# Patient Record
Sex: Female | Born: 1935 | Race: White | Hispanic: No | Marital: Married | Smoking: Never smoker
Health system: Southern US, Community
[De-identification: ages and names within clinical notes are randomized; demographics above are authoritative.]

## PROBLEM LIST (undated history)

## (undated) DIAGNOSIS — M069 Rheumatoid arthritis, unspecified: Secondary | ICD-10-CM

## (undated) DIAGNOSIS — K219 Gastro-esophageal reflux disease without esophagitis: Secondary | ICD-10-CM

## (undated) DIAGNOSIS — G4700 Insomnia, unspecified: Secondary | ICD-10-CM

## (undated) DIAGNOSIS — D649 Anemia, unspecified: Secondary | ICD-10-CM

## (undated) DIAGNOSIS — M5136 Other intervertebral disc degeneration, lumbar region: Secondary | ICD-10-CM

## (undated) DIAGNOSIS — K589 Irritable bowel syndrome without diarrhea: Secondary | ICD-10-CM

## (undated) DIAGNOSIS — E039 Hypothyroidism, unspecified: Secondary | ICD-10-CM

## (undated) DIAGNOSIS — R6889 Other general symptoms and signs: Secondary | ICD-10-CM

## (undated) DIAGNOSIS — R682 Dry mouth, unspecified: Secondary | ICD-10-CM

## (undated) DIAGNOSIS — E232 Diabetes insipidus: Secondary | ICD-10-CM

## (undated) DIAGNOSIS — F0280 Dementia in other diseases classified elsewhere without behavioral disturbance: Secondary | ICD-10-CM

## (undated) DIAGNOSIS — G309 Alzheimer's disease, unspecified: Secondary | ICD-10-CM

## (undated) DIAGNOSIS — M810 Age-related osteoporosis without current pathological fracture: Secondary | ICD-10-CM

## (undated) HISTORY — PX: ABDOMINAL HYSTERECTOMY: SHX81

## (undated) HISTORY — PX: OTHER SURGICAL HISTORY: SHX169

---

## 1998-05-08 ENCOUNTER — Ambulatory Visit (HOSPITAL_COMMUNITY): Admission: RE | Admit: 1998-05-08 | Discharge: 1998-05-08 | Payer: Self-pay | Admitting: Cardiology

## 1999-05-28 ENCOUNTER — Encounter: Payer: Self-pay | Admitting: Cardiology

## 1999-05-28 ENCOUNTER — Ambulatory Visit (HOSPITAL_COMMUNITY): Admission: RE | Admit: 1999-05-28 | Discharge: 1999-05-28 | Payer: Self-pay | Admitting: Cardiology

## 1999-06-26 ENCOUNTER — Other Ambulatory Visit: Admission: RE | Admit: 1999-06-26 | Discharge: 1999-06-26 | Payer: Self-pay | Admitting: Cardiology

## 2000-08-20 ENCOUNTER — Ambulatory Visit (HOSPITAL_COMMUNITY): Admission: RE | Admit: 2000-08-20 | Discharge: 2000-08-20 | Payer: Self-pay | Admitting: Cardiology

## 2000-08-20 ENCOUNTER — Encounter (HOSPITAL_BASED_OUTPATIENT_CLINIC_OR_DEPARTMENT_OTHER): Payer: Self-pay | Admitting: Internal Medicine

## 2001-08-25 ENCOUNTER — Encounter (HOSPITAL_BASED_OUTPATIENT_CLINIC_OR_DEPARTMENT_OTHER): Payer: Self-pay | Admitting: Internal Medicine

## 2001-08-25 ENCOUNTER — Ambulatory Visit (HOSPITAL_COMMUNITY): Admission: RE | Admit: 2001-08-25 | Discharge: 2001-08-25 | Payer: Self-pay | Admitting: Internal Medicine

## 2001-09-08 ENCOUNTER — Ambulatory Visit (HOSPITAL_BASED_OUTPATIENT_CLINIC_OR_DEPARTMENT_OTHER): Admission: RE | Admit: 2001-09-08 | Discharge: 2001-09-08 | Payer: Self-pay | Admitting: Urology

## 2001-09-08 ENCOUNTER — Encounter (INDEPENDENT_AMBULATORY_CARE_PROVIDER_SITE_OTHER): Payer: Self-pay | Admitting: Specialist

## 2002-11-16 ENCOUNTER — Ambulatory Visit (HOSPITAL_COMMUNITY): Admission: RE | Admit: 2002-11-16 | Discharge: 2002-11-16 | Payer: Self-pay | Admitting: Internal Medicine

## 2003-02-09 ENCOUNTER — Encounter (HOSPITAL_BASED_OUTPATIENT_CLINIC_OR_DEPARTMENT_OTHER): Payer: Self-pay | Admitting: Internal Medicine

## 2003-02-09 ENCOUNTER — Ambulatory Visit (HOSPITAL_COMMUNITY): Admission: RE | Admit: 2003-02-09 | Discharge: 2003-02-09 | Payer: Self-pay | Admitting: Internal Medicine

## 2004-05-10 ENCOUNTER — Ambulatory Visit (HOSPITAL_COMMUNITY): Admission: RE | Admit: 2004-05-10 | Discharge: 2004-05-10 | Payer: Self-pay | Admitting: Gastroenterology

## 2005-09-09 ENCOUNTER — Ambulatory Visit (HOSPITAL_COMMUNITY): Admission: RE | Admit: 2005-09-09 | Discharge: 2005-09-09 | Payer: Self-pay | Admitting: Gastroenterology

## 2006-10-01 ENCOUNTER — Ambulatory Visit (HOSPITAL_COMMUNITY): Admission: RE | Admit: 2006-10-01 | Discharge: 2006-10-01 | Payer: Self-pay | Admitting: Internal Medicine

## 2010-05-01 ENCOUNTER — Ambulatory Visit (HOSPITAL_COMMUNITY): Admission: RE | Admit: 2010-05-01 | Discharge: 2010-05-01 | Payer: Self-pay | Admitting: Internal Medicine

## 2011-01-11 NOTE — Op Note (Signed)
[facility]  Patient:    Christina Garcia, Christina Garcia Visit Number: [id number] MRN: [id number]          Service Type: NES Location: [facility] Attending Physician:  Tania Ade Dictated by:   Lucrezia Starch. Ovidio Hanger, M.D. Proc. Date: 09/08/01 Admit Date:  09/08/2001                             Operative Report  DIAGNOSIS:  Hematuria.  OPERATIVE PROCEDURE:  Cystourethroscopy, examination under anesthesia, bladder biopsy, and barbotage bladder cytology.  SURGEON:  Lucrezia Starch. Ovidio Hanger, M.D.  ANESTHESIA:  General laryngeal airway.  BLOOD LOSS:  Negligible.  TUBES:  None.  COMPLICATIONS:  None.  INDICATION FOR PROCEDURE:  [name] is a lovely 77 year old white female, who has presented with pelvic pain, low back pain, and pain in her groin area. She was subsequently found to have microhematuria and underwent the work-up and on cystourethroscopy, was found to have areas of cystitis cystica and glandularis, diffuse inflammation of the bladder with some trabeculation, and it was felt the above procedure was indicated.  After understanding the risks, benefits, and alternatives, she has elected to proceed.  PROCEDURE IN DETAIL:  The patient was placed in the supine position.  After proper general laryngeal airway anesthesia, was placed in the dorsal lithotomy position and prepped and draped with Betadine in a sterile fashion. Cystourethroscopy was performed with a 22.5 French Olympus panendoscope utilizing the 12 and 70 degree lenses.  The bladder was carefully inspected. Efflux of clear urine was noted from the normally-placed ureteral orifices bilaterally.  The urethra appeared to be essentially normal.  The trigone had some trigonitis along with some areas of cystitis glandularis.  There was grade 1 trabeculation in the posterior bladder wall.  There was some significant diffuse inflammation.  It was felt that biopsy was indicated. Barbotage bladder  cytologies were obtained with saline and submitted to pathology.  The posterior midline was biopsied with cold cup biopsy forceps. There was an area of inflammation there and submitted to pathology, and the base was cauterized with Bovie coagulation cautery.  The bladder was drained. The panendoscope was removed.  Examination under anesthesia revealed no palpable abnormalities in the bladder.  The patient tolerated the procedure well and was taken to the recovery room stable. Dictated by:   Lucrezia Starch. Ovidio Hanger, M.D. Attending Physician:  Tania Ade DD:  09/08/01 TD:  09/08/01 Job: [phone] [id number]

## 2013-10-01 ENCOUNTER — Other Ambulatory Visit (HOSPITAL_COMMUNITY): Payer: Self-pay | Admitting: Internal Medicine

## 2013-10-01 DIAGNOSIS — Z1231 Encounter for screening mammogram for malignant neoplasm of breast: Secondary | ICD-10-CM

## 2013-10-28 ENCOUNTER — Ambulatory Visit (HOSPITAL_COMMUNITY)
Admission: RE | Admit: 2013-10-28 | Discharge: 2013-10-28 | Disposition: A | Discharge status: Home / Self Care | Payer: Medicare Other | Source: Ambulatory Visit | Attending: Internal Medicine | Admitting: Internal Medicine

## 2013-10-28 DIAGNOSIS — Z1231 Encounter for screening mammogram for malignant neoplasm of breast: Secondary | ICD-10-CM | POA: Insufficient documentation

## 2014-10-31 ENCOUNTER — Encounter (HOSPITAL_COMMUNITY)
Admission: RE | Admit: 2014-10-31 | Discharge: 2014-10-31 | Disposition: A | Discharge status: Home / Self Care | Payer: PPO | Source: Ambulatory Visit | Attending: Internal Medicine | Admitting: Internal Medicine

## 2014-10-31 ENCOUNTER — Other Ambulatory Visit (HOSPITAL_COMMUNITY): Payer: Self-pay | Admitting: Internal Medicine

## 2014-10-31 ENCOUNTER — Encounter (HOSPITAL_COMMUNITY): Payer: Self-pay

## 2014-10-31 DIAGNOSIS — M810 Age-related osteoporosis without current pathological fracture: Secondary | ICD-10-CM | POA: Diagnosis not present

## 2014-10-31 HISTORY — DX: Other general symptoms and signs: R68.89

## 2014-10-31 HISTORY — DX: Age-related osteoporosis without current pathological fracture: M81.0

## 2014-10-31 HISTORY — DX: Dry mouth, unspecified: R68.2

## 2014-10-31 MED ORDER — DENOSUMAB 60 MG/ML ~~LOC~~ SOLN
60.0000 mg | Freq: Once | SUBCUTANEOUS | Status: AC
  Administered 2014-10-31: 60 mg via SUBCUTANEOUS
  Filled 2014-10-31: qty 1

## 2014-10-31 NOTE — Discharge Instructions (Signed)
Prolia °Denosumab injection °What is this medicine? °DENOSUMAB (den oh sue mab) slows bone breakdown. Prolia is used to treat osteoporosis in women after menopause and in men. Xgeva is used to prevent bone fractures and other bone problems caused by cancer bone metastases. Xgeva is also used to treat giant cell tumor of the bone. °This medicine may be used for other purposes; ask your health care provider or pharmacist if you have questions. °COMMON BRAND NAME(S): Prolia, XGEVA °What should I tell my health care provider before I take this medicine? °They need to know if you have any of these conditions: °-dental disease °-eczema °-infection or history of infections °-kidney disease or on dialysis °-low blood calcium or vitamin D °-malabsorption syndrome °-scheduled to have surgery or tooth extraction °-taking medicine that contains denosumab °-thyroid or parathyroid disease °-an unusual reaction to denosumab, other medicines, foods, dyes, or preservatives °-pregnant or trying to get pregnant °-breast-feeding °How should I use this medicine? °This medicine is for injection under the skin. It is given by a health care professional in a hospital or clinic setting. °If you are getting Prolia, a special MedGuide will be given to you by the pharmacist with each prescription and refill. Be sure to read this information carefully each time. °For Prolia, talk to your pediatrician regarding the use of this medicine in children. Special care may be needed. For Xgeva, talk to your pediatrician regarding the use of this medicine in children. While this drug may be prescribed for children as young as 13 years for selected conditions, precautions do apply. °Overdosage: If you think you've taken too much of this medicine contact a poison control center or emergency room at once. °Overdosage: If you think you have taken too much of this medicine contact a poison control center or emergency room at once. °NOTE: This medicine is  only for you. Do not share this medicine with others. °What if I miss a dose? °It is important not to miss your dose. Call your doctor or health care professional if you are unable to keep an appointment. °What may interact with this medicine? °Do not take this medicine with any of the following medications: °-other medicines containing denosumab °This medicine may also interact with the following medications: °-medicines that suppress the immune system °-medicines that treat cancer °-steroid medicines like prednisone or cortisone °This list may not describe all possible interactions. Give your health care provider a list of all the medicines, herbs, non-prescription drugs, or dietary supplements you use. Also tell them if you smoke, drink alcohol, or use illegal drugs. Some items may interact with your medicine. °What should I watch for while using this medicine? °Visit your doctor or health care professional for regular checks on your progress. Your doctor or health care professional may order blood tests and other tests to see how you are doing. °Call your doctor or health care professional if you get a cold or other infection while receiving this medicine. Do not treat yourself. This medicine may decrease your body's ability to fight infection. °You should make sure you get enough calcium and vitamin D while you are taking this medicine, unless your doctor tells you not to. Discuss the foods you eat and the vitamins you take with your health care professional. °See your dentist regularly. Brush and floss your teeth as directed. Before you have any dental work done, tell your dentist you are receiving this medicine. °Do not become pregnant while taking this medicine or for 5 months after   stopping it. Women should inform their doctor if they wish to become pregnant or think they might be pregnant. There is a potential for serious side effects to an unborn child. Talk to your health care professional or pharmacist  for more information. °What side effects may I notice from receiving this medicine? °Side effects that you should report to your doctor or health care professional as soon as possible: °-allergic reactions like skin rash, itching or hives, swelling of the face, lips, or tongue °-breathing problems °-chest pain °-fast, irregular heartbeat °-feeling faint or lightheaded, falls °-fever, chills, or any other sign of infection °-muscle spasms, tightening, or twitches °-numbness or tingling °-skin blisters or bumps, or is dry, peels, or red °-slow healing or unexplained pain in the mouth or jaw °-unusual bleeding or bruising °Side effects that usually do not require medical attention (Report these to your doctor or health care professional if they continue or are bothersome.): °-muscle pain °-stomach upset, gas °This list may not describe all possible side effects. Call your doctor for medical advice about side effects. You may report side effects to FDA at 1-800-FDA-1088. °Where should I keep my medicine? °This medicine is only given in a clinic, doctor's office, or other health care setting and will not be stored at home. °NOTE: This sheet is a summary. It may not cover all possible information. If you have questions about this medicine, talk to your doctor, pharmacist, or health care provider. °© 2015, Elsevier/Gold Standard. (2012-02-10 12:37:47) °Osteoporosis °Throughout your life, your body breaks down old bone and replaces it with new bone. As you get older, your body does not replace bone as quickly as it breaks it down. By the age of 30 years, most people begin to gradually lose bone because of the imbalance between bone loss and replacement. Some people lose more bone than others. Bone loss beyond a specified normal degree is considered osteoporosis.  °Osteoporosis affects the strength and durability of your bones. The inside of the ends of your bones and your flat bones, like the bones of your pelvis, look like  honeycomb, filled with tiny open spaces. As bone loss occurs, your bones become less dense. This means that the open spaces inside your bones become bigger and the walls between these spaces become thinner. This makes your bones weaker. Bones of a person with osteoporosis can become so weak that they can break (fracture) during minor accidents, such as a simple fall. °CAUSES  °The following factors have been associated with the development of osteoporosis: °· Smoking. °· Drinking more than 2 alcoholic drinks several days per week. °· Long-term use of certain medicines: °¨ Corticosteroids. °¨ Chemotherapy medicines. °¨ Thyroid medicines. °¨ Antiepileptic medicines. °¨ Gonadal hormone suppression medicine. °¨ Immunosuppression medicine. °· Being underweight. °· Lack of physical activity. °· Lack of exposure to the sun. This can lead to vitamin D deficiency. °· Certain medical conditions: °¨ Certain inflammatory bowel diseases, such as Crohn disease and ulcerative colitis. °¨ Diabetes. °¨ Hyperthyroidism. °¨ Hyperparathyroidism. °RISK FACTORS °Anyone can develop osteoporosis. However, the following factors can increase your risk of developing osteoporosis: °· Gender--Women are at higher risk than men. °· Age--Being older than 50 years increases your risk. °· Ethnicity--White and Asian people have an increased risk. °· Weight --Being extremely underweight can increase your risk of osteoporosis. °· Family history of osteoporosis--Having a family member who has developed osteoporosis can increase your risk. °SYMPTOMS  °Usually, people with osteoporosis have no symptoms.  °DIAGNOSIS  °Signs during   a physical exam that may prompt your caregiver to suspect osteoporosis include: °· Decreased height. This is usually caused by the compression of the bones that form your spine (vertebrae) because they have weakened and become fractured. °· A curving or rounding of the upper back (kyphosis). °To confirm signs of osteoporosis,  your caregiver may request a procedure that uses 2 low-dose X-ray beams with different levels of energy to measure your bone mineral density (dual-energy X-ray absorptiometry [DXA]). Also, your caregiver may check your level of vitamin D. °TREATMENT  °The goal of osteoporosis treatment is to strengthen bones in order to decrease the risk of bone fractures. There are different types of medicines available to help achieve this goal. Some of these medicines work by slowing the processes of bone loss. Some medicines work by increasing bone density. Treatment also involves making sure that your levels of calcium and vitamin D are adequate. °PREVENTION  °There are things you can do to help prevent osteoporosis. Adequate intake of calcium and vitamin D can help you achieve optimal bone mineral density. Regular exercise can also help, especially resistance and weight-bearing activities. If you smoke, quitting smoking is an important part of osteoporosis prevention. °MAKE SURE YOU: °· Understand these instructions. °· Will watch your condition. °· Will get help right away if you are not doing well or get worse. °FOR MORE INFORMATION °www.osteo.org and www.nof.org °Document Released: 05/22/2005 Document Revised: 12/07/2012 Document Reviewed: 07/27/2011 °ExitCare® Patient Information ©2015 [employer]. This information is not intended to replace advice given to you by your health care provider. Make sure you discuss any questions you have with your health care provider. ° ° °

## 2014-10-31 NOTE — Progress Notes (Signed)
Pt here for her PROLIA injection. She had been getting it in Dr [employer] office and now coming ot Short Stay every 6 month. Pt and husband accompanied by son and are poor historians as far as medications but medication list faxed from Dr Silvano Rusk office was transcribed into [facility]. They are unsure if she takes oral calcium but she "eats a lot of cheese and dairy" and Vitamin D is listed on her medication list. Uneventful injection of Prolia and patient was discharged ambulatory accompanied by husband and son to [employer] exit.

## 2015-01-29 ENCOUNTER — Ambulatory Visit (INDEPENDENT_AMBULATORY_CARE_PROVIDER_SITE_OTHER): Payer: PPO | Admitting: Emergency Medicine

## 2015-01-29 VITALS — BP 136/72 | HR 80 | Temp 98.4°F | Resp 16 | Ht 59.5 in | Wt 118.4 lb

## 2015-01-29 DIAGNOSIS — J209 Acute bronchitis, unspecified: Secondary | ICD-10-CM | POA: Diagnosis not present

## 2015-01-29 NOTE — Progress Notes (Signed)
Subjective:  Patient ID: Christina Garcia, female    DOB: 08-28-1932  Age: 81 y.o. MRN: [id number]  CC: URI and Wheezing   HPI Christina Garcia presents   For follow-up of her bronchitis. She was treated by phone by her family doctor last week. She was noted to have a cough and gurgling at night. He prescribed Zithromax and she completed the medication today. The family was concerned that she may still have residual illness. She has very little cough and the cough that she has nonproductive she has no wheezing or shortness of breath. No nausea or vomiting. No fever or chills. No rash. No gastrointestinal symptoms of any kind.. Maintaining her weight. She is eating and drinking well.  History Christina Garcia has a past medical history of Osteoporosis; Dry mouth; and Forgetfulness.   She has past surgical history that includes none.   Her  family history is not on file.  She   reports that she has never smoked. She does not have any smokeless tobacco history on file. She reports that she does not drink alcohol or use illicit drugs.  Outpatient Prescriptions Prior to Visit  Medication Sig Dispense Refill  . aspirin 81 MG tablet Take 81 mg by mouth daily.    Marland Kitchen denosumab (PROLIA) 60 MG/ML SOLN injection Inject 60 mg into the skin every 6 (six) months. Administer in upper arm, thigh, or abdomen    . desmopressin (DDAVP) 0.2 MG tablet Take 0.2 mg by mouth 2 (two) times daily.    Marland Kitchen donepezil (ARICEPT) 5 MG tablet Take 5 mg by mouth at bedtime.    . ergocalciferol (VITAMIN D2) 50000 UNITS capsule Take 50,000 Units by mouth once a week.    . levothyroxine (SYNTHROID, LEVOTHROID) 75 MCG tablet Take 75 mcg by mouth daily before breakfast.    . Linaclotide (LINZESS) 290 MCG CAPS capsule Take 290 mcg by mouth daily before breakfast.    . memantine (NAMENDA XR) 28 MG CP24 24 hr capsule Take 28 mg by mouth.    . mometasone (ELOCON) 0.1 % cream Apply 1 application topically as needed.    . naproxen sodium  (ANAPROX) 220 MG tablet Take 220 mg by mouth as needed.    . pravastatin (PRAVACHOL) 20 MG tablet Take 20 mg by mouth daily.     No facility-administered medications prior to visit.    History   Social History  . Marital Status: Married    Spouse Name: N/A  . Number of Children: N/A  . Years of Education: N/A   Social History Main Topics  . Smoking status: Never Smoker   . Smokeless tobacco: Not on file  . Alcohol Use: No  . Drug Use: No  . Sexual Activity: Not on file   Other Topics Concern  . None   Social History Narrative     Review of Systems  Constitutional: Positive for fatigue. Negative for fever, chills and appetite change.  HENT: Positive for congestion and rhinorrhea. Negative for ear pain, postnasal drip, sinus pressure and sore throat.   Eyes: Negative for pain and redness.  Respiratory: Positive for cough and wheezing. Negative for shortness of breath.   Cardiovascular: Negative for leg swelling.  Gastrointestinal: Negative for nausea, vomiting, abdominal pain, diarrhea, constipation and blood in stool.  Endocrine: Negative for polyuria.  Genitourinary: Negative for dysuria, urgency, frequency and flank pain.  Musculoskeletal: Negative for gait problem.  Skin: Negative for rash.  Neurological: Positive for headaches. Negative for weakness.  Psychiatric/Behavioral: Negative for confusion and decreased concentration. The patient is not nervous/anxious.     Objective:  BP 136/72 mmHg  Pulse 80  Temp(Src) 98.4 F (36.9 C) (Oral)  Resp 16  Ht 4' 11.5" (1.511 m)  Wt 118 lb 6.4 oz (53.706 kg)  BMI 23.52 kg/m2  SpO2 98%  Physical Exam  Constitutional: She is oriented to person, place, and time. She appears well-developed and well-nourished. No distress.  HENT:  Head: Normocephalic and atraumatic.  Right Ear: External ear normal.  Left Ear: External ear normal.  Nose: Nose normal.  Eyes: Conjunctivae and EOM are normal. Pupils are equal, round, and  reactive to light. No scleral icterus.  Neck: Normal range of motion. Neck supple. No tracheal deviation present.  Cardiovascular: Normal rate, regular rhythm and normal heart sounds.   Pulmonary/Chest: Effort normal. No respiratory distress. She has no wheezes. She has no rales.  Abdominal: She exhibits no mass. There is no tenderness. There is no rebound and no guarding.  Musculoskeletal: She exhibits no edema.  Lymphadenopathy:    She has no cervical adenopathy.  Neurological: She is alert and oriented to person, place, and time. Coordination normal.  Skin: Skin is warm and dry. No rash noted.  Psychiatric: She has a normal mood and affect. Her behavior is normal.      Assessment & Plan:   Meiah was seen today for uri and wheezing.  Diagnoses and all orders for this visit:  Acute bronchitis, unspecified organism  I am having Ms. Fowles maintain her ergocalciferol, naproxen sodium, mometasone, aspirin, desmopressin, donepezil, Linaclotide, pravastatin, levothyroxine, memantine, and denosumab.  No orders of the defined types were placed in this encounter.    Appropriate red flag conditions were discussed with the patient as well as actions that should be taken.  Patient expressed his understanding.  Follow-up: Return if symptoms worsen or fail to improve.  Carmelina Dane, MD

## 2015-01-29 NOTE — Patient Instructions (Signed)

## 2015-02-21 ENCOUNTER — Emergency Department (HOSPITAL_COMMUNITY)
Admission: EM | Admit: 2015-02-21 | Discharge: 2015-02-21 | Disposition: A | Discharge status: Home / Self Care | Payer: PPO | Attending: Emergency Medicine | Admitting: Emergency Medicine

## 2015-02-21 ENCOUNTER — Emergency Department (HOSPITAL_COMMUNITY): Payer: PPO

## 2015-02-21 ENCOUNTER — Encounter (HOSPITAL_COMMUNITY): Payer: Self-pay | Admitting: Emergency Medicine

## 2015-02-21 DIAGNOSIS — Z79899 Other long term (current) drug therapy: Secondary | ICD-10-CM | POA: Insufficient documentation

## 2015-02-21 DIAGNOSIS — M79602 Pain in left arm: Secondary | ICD-10-CM | POA: Diagnosis not present

## 2015-02-21 DIAGNOSIS — F0390 Unspecified dementia without behavioral disturbance: Secondary | ICD-10-CM | POA: Diagnosis not present

## 2015-02-21 DIAGNOSIS — Z7982 Long term (current) use of aspirin: Secondary | ICD-10-CM | POA: Insufficient documentation

## 2015-02-21 DIAGNOSIS — R079 Chest pain, unspecified: Secondary | ICD-10-CM | POA: Diagnosis present

## 2015-02-21 DIAGNOSIS — M810 Age-related osteoporosis without current pathological fracture: Secondary | ICD-10-CM | POA: Diagnosis not present

## 2015-02-21 DIAGNOSIS — R52 Pain, unspecified: Secondary | ICD-10-CM

## 2015-02-21 LAB — CBC WITH DIFFERENTIAL/PLATELET
Basophils Absolute: 0 10*3/uL (ref 0.0–0.1)
Basophils Relative: 0 % (ref 0–1)
EOS PCT: 2 % (ref 0–5)
Eosinophils Absolute: 0.2 10*3/uL (ref 0.0–0.7)
HCT: 36.8 % (ref 36.0–46.0)
Hemoglobin: 12.2 g/dL (ref 12.0–15.0)
Lymphocytes Relative: 22 % (ref 12–46)
Lymphs Abs: 1.5 10*3/uL (ref 0.7–4.0)
MCH: 31.6 pg (ref 26.0–34.0)
MCHC: 33.2 g/dL (ref 30.0–36.0)
MCV: 95.3 fL (ref 78.0–100.0)
MONOS PCT: 11 % (ref 3–12)
Monocytes Absolute: 0.8 10*3/uL (ref 0.1–1.0)
NEUTROS ABS: 4.6 10*3/uL (ref 1.7–7.7)
NEUTROS PCT: 65 % (ref 43–77)
PLATELETS: 238 10*3/uL (ref 150–400)
RBC: 3.86 MIL/uL — AB (ref 3.87–5.11)
RDW: 14.3 % (ref 11.5–15.5)
WBC: 7 10*3/uL (ref 4.0–10.5)

## 2015-02-21 LAB — COMPREHENSIVE METABOLIC PANEL
ALT: 12 U/L — ABNORMAL LOW (ref 14–54)
ANION GAP: 7 (ref 5–15)
AST: 21 U/L (ref 15–41)
Albumin: 3.6 g/dL (ref 3.5–5.0)
Alkaline Phosphatase: 68 U/L (ref 38–126)
BILIRUBIN TOTAL: 0.6 mg/dL (ref 0.3–1.2)
BUN: 11 mg/dL (ref 6–20)
CO2: 27 mmol/L (ref 22–32)
Calcium: 8.5 mg/dL — ABNORMAL LOW (ref 8.9–10.3)
Chloride: 105 mmol/L (ref 101–111)
Creatinine, Ser: 0.69 mg/dL (ref 0.44–1.00)
GFR calc Af Amer: >60 mL/min (ref >60)
GLUCOSE: 98 mg/dL (ref 65–99)
POTASSIUM: 4 mmol/L (ref 3.5–5.1)
SODIUM: 139 mmol/L (ref 135–145)
TOTAL PROTEIN: 6.8 g/dL (ref 6.5–8.1)

## 2015-02-21 LAB — BRAIN NATRIURETIC PEPTIDE: B NATRIURETIC PEPTIDE 5: 43.6 pg/mL (ref 0.0–100.0)

## 2015-02-21 LAB — I-STAT TROPONIN, ED: TROPONIN I, POC: 0 ng/mL (ref 0.00–0.08)

## 2015-02-21 NOTE — ED Notes (Signed)
Pt left with family and all belongings. Pt refused wheelchair.

## 2015-02-21 NOTE — Discharge Instructions (Signed)
Follow up with your md as planned next week.  Return if problems

## 2015-02-21 NOTE — ED Notes (Signed)
Pt arrives via [facility] for c/o pain in left shoulder and chest that began today. Pt denied chest pain with ems. Was given 324mg  asa. Pt alert, hx of dementia.

## 2015-02-24 NOTE — ED Provider Notes (Signed)
CSN: [id number]     Arrival date & time 02/21/15  1629 History   First MD Initiated Contact with Patient 02/21/15 1632     Chief Complaint  Patient presents with  . Chest Pain  . Arm Pain     (Consider location/radiation/quality/duration/timing/severity/associated sxs/prior Treatment) Patient is a 81 y.o. female presenting with chest pain and arm pain. The history is provided by a relative (pt with dementia.  she had chest pain earlier today.  no pain now).  Chest Pain Pain location:  Substernal area Pain quality: aching   Pain radiates to:  Does not radiate Pain radiates to the back: no   Pain severity:  Mild Onset quality:  Gradual Associated symptoms: no abdominal pain, no back pain, no cough, no fatigue and no headache   Arm Pain Associated symptoms include chest pain. Pertinent negatives include no abdominal pain and no headaches.    Past Medical History  Diagnosis Date  . Osteoporosis   . Dry mouth     husband states she has issue with saliva glands and has to take medicaion fot hsi  . Forgetfulness    Past Surgical History  Procedure Laterality Date  . None     No family history on file. History  Substance Use Topics  . Smoking status: Never Smoker   . Smokeless tobacco: Not on file  . Alcohol Use: No   OB History    No data available     Review of Systems  Constitutional: Negative for appetite change and fatigue.  HENT: Negative for congestion, ear discharge and sinus pressure.   Eyes: Negative for discharge.  Respiratory: Negative for cough.   Cardiovascular: Positive for chest pain.  Gastrointestinal: Negative for abdominal pain and diarrhea.  Genitourinary: Negative for frequency and hematuria.  Musculoskeletal: Negative for back pain.  Skin: Negative for rash.  Neurological: Negative for seizures and headaches.  Psychiatric/Behavioral: Negative for hallucinations.      Allergies  Keflex and Fosamax  Home Medications   Prior to Admission  medications   Medication Sig Start Date End Date Taking? Authorizing Provider  aspirin 81 MG tablet Take 81 mg by mouth daily.    Historical Provider, MD  denosumab (PROLIA) 60 MG/ML SOLN injection Inject 60 mg into the skin every 6 (six) months. Administer in upper arm, thigh, or abdomen    Historical Provider, MD  desmopressin (DDAVP) 0.2 MG tablet Take 0.2 mg by mouth 2 (two) times daily.    Historical Provider, MD  donepezil (ARICEPT) 5 MG tablet Take 5 mg by mouth at bedtime.    Historical Provider, MD  ergocalciferol (VITAMIN D2) 50000 UNITS capsule Take 50,000 Units by mouth once a week.    Historical Provider, MD  levothyroxine (SYNTHROID, LEVOTHROID) 75 MCG tablet Take 75 mcg by mouth daily before breakfast.    Historical Provider, MD  Linaclotide (LINZESS) 290 MCG CAPS capsule Take 290 mcg by mouth daily before breakfast.    Historical Provider, MD  memantine (NAMENDA XR) 28 MG CP24 24 hr capsule Take 28 mg by mouth.    Historical Provider, MD  mometasone (ELOCON) 0.1 % cream Apply 1 application topically as needed.    Historical Provider, MD  naproxen sodium (ANAPROX) 220 MG tablet Take 220 mg by mouth as needed.    Historical Provider, MD  pravastatin (PRAVACHOL) 20 MG tablet Take 20 mg by mouth daily.    Historical Provider, MD   BP 134/60 mmHg  Pulse 74  Temp(Src) 98.3 F (36.8  C) (Oral)  Resp 20  SpO2 97% Physical Exam  Constitutional: She is oriented to person, place, and time. She appears well-developed.  HENT:  Head: Normocephalic.  Eyes: Conjunctivae and EOM are normal. No scleral icterus.  Neck: Neck supple. No thyromegaly present.  Cardiovascular: Normal rate and regular rhythm.  Exam reveals no gallop and no friction rub.   No murmur heard. Pulmonary/Chest: No stridor. She has no wheezes. She has no rales. She exhibits no tenderness.  Abdominal: She exhibits no distension. There is no tenderness. There is no rebound.  Musculoskeletal: Normal range of motion. She  exhibits no edema.  Lymphadenopathy:    She has no cervical adenopathy.  Neurological: She is oriented to person, place, and time. She exhibits normal muscle tone. Coordination normal.  Skin: No rash noted. No erythema.  Psychiatric: She has a normal mood and affect. Her behavior is normal.    ED Course  Procedures (including critical care time) Labs Review Labs Reviewed  CBC WITH DIFFERENTIAL/PLATELET - Abnormal; Notable for the following:    RBC 3.86 (*)    All other components within normal limits  COMPREHENSIVE METABOLIC PANEL - Abnormal; Notable for the following:    Calcium 8.5 (*)    ALT 12 (*)    All other components within normal limits  BRAIN NATRIURETIC PEPTIDE  I-STAT TROPOININ, ED    Imaging Review No results found.   EKG Interpretation   Date/Time:  Tuesday February 21 2015 16:42:22 EDT Ventricular Rate:  71 PR Interval:  147 QRS Duration: 100 QT Interval:  401 QTC Calculation: 436 R Axis:   39 Text Interpretation:  Sinus rhythm Confirmed by Amarrion Pastorino  MD, Sherolyn Trettin ([id number])  on 02/21/2015 4:46:02 PM      MDM   Final diagnoses:  Pain  Chest pain at rest    Pt with chest pain prior to er visit,  She does not complain of pain now.  Pt with dementia.  Nl troponin and no st changes for cad,   Chest x-ray and labs unremarkable,  Doubt cad.  Pt to follow up this week with pcp   Bethann Berkshire, MD 02/24/15 1420

## 2015-05-02 ENCOUNTER — Encounter (HOSPITAL_COMMUNITY): Admission: RE | Admit: 2015-05-02 | Payer: PPO | Source: Ambulatory Visit

## 2015-08-30 ENCOUNTER — Ambulatory Visit (HOSPITAL_COMMUNITY)
Admission: RE | Admit: 2015-08-30 | Discharge: 2015-08-30 | Disposition: A | Discharge status: Home / Self Care | Payer: PPO | Source: Ambulatory Visit | Attending: Internal Medicine | Admitting: Internal Medicine

## 2015-09-07 ENCOUNTER — Encounter: Payer: Self-pay | Admitting: Gastroenterology

## 2015-11-10 DIAGNOSIS — E784 Other hyperlipidemia: Secondary | ICD-10-CM | POA: Diagnosis not present

## 2015-11-10 DIAGNOSIS — G308 Other Alzheimer's disease: Secondary | ICD-10-CM | POA: Diagnosis not present

## 2015-11-10 DIAGNOSIS — M810 Age-related osteoporosis without current pathological fracture: Secondary | ICD-10-CM | POA: Diagnosis not present

## 2015-11-10 DIAGNOSIS — L309 Dermatitis, unspecified: Secondary | ICD-10-CM | POA: Diagnosis not present

## 2015-11-10 DIAGNOSIS — E038 Other specified hypothyroidism: Secondary | ICD-10-CM | POA: Diagnosis not present

## 2015-11-10 DIAGNOSIS — R634 Abnormal weight loss: Secondary | ICD-10-CM | POA: Diagnosis not present

## 2015-11-10 DIAGNOSIS — Z6822 Body mass index (BMI) 22.0-22.9, adult: Secondary | ICD-10-CM | POA: Diagnosis not present

## 2015-11-10 DIAGNOSIS — G4709 Other insomnia: Secondary | ICD-10-CM | POA: Diagnosis not present

## 2015-11-10 DIAGNOSIS — R197 Diarrhea, unspecified: Secondary | ICD-10-CM | POA: Diagnosis not present

## 2015-11-10 DIAGNOSIS — Z0000 Encounter for general adult medical examination without abnormal findings: Secondary | ICD-10-CM | POA: Diagnosis not present

## 2015-11-20 ENCOUNTER — Inpatient Hospital Stay (HOSPITAL_COMMUNITY)
Admission: EM | Admit: 2015-11-20 | Discharge: 2015-11-23 | DRG: 640 | Disposition: A | Discharge status: Skilled Nursing Facility | Payer: PPO | Source: Skilled Nursing Facility | Attending: Internal Medicine | Admitting: Internal Medicine

## 2015-11-20 ENCOUNTER — Encounter (HOSPITAL_COMMUNITY): Payer: Self-pay | Admitting: Emergency Medicine

## 2015-11-20 ENCOUNTER — Emergency Department (HOSPITAL_COMMUNITY): Payer: PPO

## 2015-11-20 DIAGNOSIS — Z888 Allergy status to other drugs, medicaments and biological substances status: Secondary | ICD-10-CM

## 2015-11-20 DIAGNOSIS — R7989 Other specified abnormal findings of blood chemistry: Secondary | ICD-10-CM | POA: Diagnosis not present

## 2015-11-20 DIAGNOSIS — E876 Hypokalemia: Secondary | ICD-10-CM | POA: Diagnosis not present

## 2015-11-20 DIAGNOSIS — N390 Urinary tract infection, site not specified: Secondary | ICD-10-CM | POA: Diagnosis present

## 2015-11-20 DIAGNOSIS — F0391 Unspecified dementia with behavioral disturbance: Secondary | ICD-10-CM | POA: Diagnosis not present

## 2015-11-20 DIAGNOSIS — Z7982 Long term (current) use of aspirin: Secondary | ICD-10-CM

## 2015-11-20 DIAGNOSIS — E870 Hyperosmolality and hypernatremia: Secondary | ICD-10-CM | POA: Diagnosis present

## 2015-11-20 DIAGNOSIS — R40242 Glasgow coma scale score 9-12, unspecified time: Secondary | ICD-10-CM | POA: Diagnosis not present

## 2015-11-20 DIAGNOSIS — R401 Stupor: Secondary | ICD-10-CM | POA: Diagnosis not present

## 2015-11-20 DIAGNOSIS — E039 Hypothyroidism, unspecified: Secondary | ICD-10-CM | POA: Diagnosis not present

## 2015-11-20 DIAGNOSIS — Z66 Do not resuscitate: Secondary | ICD-10-CM | POA: Diagnosis present

## 2015-11-20 DIAGNOSIS — G9341 Metabolic encephalopathy: Secondary | ICD-10-CM | POA: Diagnosis present

## 2015-11-20 DIAGNOSIS — R64 Cachexia: Secondary | ICD-10-CM | POA: Diagnosis present

## 2015-11-20 DIAGNOSIS — R4182 Altered mental status, unspecified: Secondary | ICD-10-CM | POA: Diagnosis not present

## 2015-11-20 DIAGNOSIS — R402411 Glasgow coma scale score 13-15, in the field [EMT or ambulance]: Secondary | ICD-10-CM | POA: Diagnosis not present

## 2015-11-20 DIAGNOSIS — G4700 Insomnia, unspecified: Secondary | ICD-10-CM | POA: Diagnosis not present

## 2015-11-20 DIAGNOSIS — M069 Rheumatoid arthritis, unspecified: Secondary | ICD-10-CM | POA: Diagnosis not present

## 2015-11-20 DIAGNOSIS — IMO0002 Reserved for concepts with insufficient information to code with codable children: Secondary | ICD-10-CM | POA: Insufficient documentation

## 2015-11-20 DIAGNOSIS — K219 Gastro-esophageal reflux disease without esophagitis: Secondary | ICD-10-CM | POA: Diagnosis not present

## 2015-11-20 DIAGNOSIS — R636 Underweight: Secondary | ICD-10-CM | POA: Diagnosis not present

## 2015-11-20 DIAGNOSIS — J9811 Atelectasis: Secondary | ICD-10-CM | POA: Diagnosis not present

## 2015-11-20 DIAGNOSIS — R21 Rash and other nonspecific skin eruption: Secondary | ICD-10-CM | POA: Diagnosis not present

## 2015-11-20 DIAGNOSIS — E232 Diabetes insipidus: Secondary | ICD-10-CM | POA: Diagnosis not present

## 2015-11-20 DIAGNOSIS — R000 Tachycardia, unspecified: Secondary | ICD-10-CM | POA: Diagnosis not present

## 2015-11-20 DIAGNOSIS — R740 Nonspecific elevation of levels of transaminase and lactic acid dehydrogenase [LDH]: Secondary | ICD-10-CM | POA: Diagnosis not present

## 2015-11-20 DIAGNOSIS — E860 Dehydration: Secondary | ICD-10-CM | POA: Diagnosis not present

## 2015-11-20 DIAGNOSIS — K649 Unspecified hemorrhoids: Secondary | ICD-10-CM | POA: Diagnosis not present

## 2015-11-20 DIAGNOSIS — L2082 Flexural eczema: Secondary | ICD-10-CM | POA: Diagnosis not present

## 2015-11-20 DIAGNOSIS — Z881 Allergy status to other antibiotic agents status: Secondary | ICD-10-CM | POA: Diagnosis not present

## 2015-11-20 DIAGNOSIS — E038 Other specified hypothyroidism: Secondary | ICD-10-CM | POA: Diagnosis not present

## 2015-11-20 DIAGNOSIS — R748 Abnormal levels of other serum enzymes: Secondary | ICD-10-CM | POA: Diagnosis not present

## 2015-11-20 DIAGNOSIS — F0390 Unspecified dementia without behavioral disturbance: Secondary | ICD-10-CM | POA: Diagnosis present

## 2015-11-20 DIAGNOSIS — N179 Acute kidney failure, unspecified: Principal | ICD-10-CM | POA: Diagnosis present

## 2015-11-20 DIAGNOSIS — D649 Anemia, unspecified: Secondary | ICD-10-CM | POA: Diagnosis not present

## 2015-11-20 HISTORY — DX: Diabetes insipidus: E23.2

## 2015-11-20 HISTORY — DX: Hypothyroidism, unspecified: E03.9

## 2015-11-20 LAB — COMPREHENSIVE METABOLIC PANEL
ALBUMIN: 4.1 g/dL (ref 3.5–5.0)
ALK PHOS: 78 U/L (ref 38–126)
ALT: 28 U/L (ref 14–54)
AST: 45 U/L — ABNORMAL HIGH (ref 15–41)
BILIRUBIN TOTAL: 0.9 mg/dL (ref 0.3–1.2)
BUN: 35 mg/dL — ABNORMAL HIGH (ref 6–20)
CALCIUM: 10.2 mg/dL (ref 8.9–10.3)
CO2: 25 mmol/L (ref 22–32)
Chloride: >130 mmol/L (ref 101–111)
Creatinine, Ser: 3.41 mg/dL — ABNORMAL HIGH (ref 0.44–1.00)
GFR calc Af Amer: 14 mL/min — ABNORMAL LOW (ref >60)
GFR, EST NON AFRICAN AMERICAN: 12 mL/min — AB (ref >60)
GLUCOSE: 134 mg/dL — AB (ref 65–99)
POTASSIUM: 4.3 mmol/L (ref 3.5–5.1)
Sodium: 174 mmol/L (ref 135–145)
TOTAL PROTEIN: 8 g/dL (ref 6.5–8.1)

## 2015-11-20 LAB — URINALYSIS, ROUTINE W REFLEX MICROSCOPIC
BILIRUBIN URINE: NEGATIVE
GLUCOSE, UA: NEGATIVE mg/dL
Ketones, ur: NEGATIVE mg/dL
Nitrite: NEGATIVE
PROTEIN: 100 mg/dL — AB
Specific Gravity, Urine: 1.013 (ref 1.005–1.030)
pH: 6 (ref 5.0–8.0)

## 2015-11-20 LAB — CBC
HEMATOCRIT: 52.8 % — AB (ref 36.0–46.0)
HEMOGLOBIN: 16.1 g/dL — AB (ref 12.0–15.0)
MCH: 32.5 pg (ref 26.0–34.0)
MCHC: 30.5 g/dL (ref 30.0–36.0)
MCV: 106.5 fL — ABNORMAL HIGH (ref 78.0–100.0)
Platelets: 290 10*3/uL (ref 150–400)
RBC: 4.96 MIL/uL (ref 3.87–5.11)
RDW: 14.3 % (ref 11.5–15.5)
WBC: 14.5 10*3/uL — AB (ref 4.0–10.5)

## 2015-11-20 LAB — URINE MICROSCOPIC-ADD ON

## 2015-11-20 LAB — PROTIME-INR
INR: 1.22 (ref 0.00–1.49)
PROTHROMBIN TIME: 15.6 s — AB (ref 11.6–15.2)

## 2015-11-20 MED ORDER — SODIUM CHLORIDE 0.9 % IV BOLUS (SEPSIS)
1000.0000 mL | Freq: Once | INTRAVENOUS | Status: AC
  Administered 2015-11-20: 1000 mL via INTRAVENOUS

## 2015-11-20 MED ORDER — ONDANSETRON HCL 4 MG PO TABS: 4.0000 mg | ORAL_TABLET | Freq: Four times a day (QID) | ORAL | Status: DC | PRN

## 2015-11-20 MED ORDER — SODIUM CHLORIDE 0.9% FLUSH
3.0000 mL | Freq: Two times a day (BID) | INTRAVENOUS | Status: DC
  Administered 2015-11-22: 3 mL via INTRAVENOUS

## 2015-11-20 MED ORDER — ACETAMINOPHEN 650 MG RE SUPP
650.0000 mg | Freq: Four times a day (QID) | RECTAL | Status: DC | PRN
Start: 2015-11-20 — End: 2015-11-23

## 2015-11-20 MED ORDER — DEXTROSE 5 % IV SOLN
INTRAVENOUS | Status: DC
  Administered 2015-11-21 (×2): via INTRAVENOUS

## 2015-11-20 MED ORDER — ACETAMINOPHEN 325 MG PO TABS: 650.0000 mg | ORAL_TABLET | Freq: Four times a day (QID) | ORAL | Status: DC | PRN

## 2015-11-20 MED ORDER — MORPHINE SULFATE (PF) 2 MG/ML IV SOLN: 1.0000 mg | INTRAVENOUS | Status: DC | PRN

## 2015-11-20 MED ORDER — ONDANSETRON HCL 4 MG/2ML IJ SOLN: 4.0000 mg | Freq: Four times a day (QID) | INTRAMUSCULAR | Status: DC | PRN

## 2015-11-20 MED ORDER — ENOXAPARIN SODIUM 30 MG/0.3ML ~~LOC~~ SOLN
30.0000 mg | SUBCUTANEOUS | Status: DC
  Administered 2015-11-21 – 2015-11-23 (×3): 30 mg via SUBCUTANEOUS
  Filled 2015-11-20 (×3): qty 0.3

## 2015-11-20 MED ORDER — DESMOPRESSIN ACETATE 4 MCG/ML IJ SOLN
2.0000 ug | Freq: Two times a day (BID) | INTRAMUSCULAR | Status: DC
Start: 2015-11-21 — End: 2015-11-21
  Administered 2015-11-21: 2 ug via SUBCUTANEOUS
  Filled 2015-11-20 (×2): qty 1
  Filled 2015-11-20: qty 0.5

## 2015-11-20 NOTE — ED Notes (Signed)
Pt arrives via EMS from [facility] [place] for [facility] and increased sodium levels in the 160s. Tachycardic and confused upon arrival to room. Hx UTI, demenita

## 2015-11-20 NOTE — ED Provider Notes (Signed)
CSN: [id number]     Arrival date & time 11/20/15  2008 History   First MD Initiated Contact with Patient 11/20/15 2029     Chief Complaint  Patient presents with  . Abnormal Lab  . Altered Mental Status     (Consider location/radiation/quality/duration/timing/severity/associated sxs/prior Treatment) HPI Patient presents with her niece who provides the history of present illness. Patient is a history of dementia, level V caveat. Angelique Blonder reports that the patient is typically ambulatory, active, verbal, though not oriented. Over the past days, patient has become much less energetic, essentially nonverbal, and her demeanor is entirely different, with more fatigue. Patient has a notable recent history of decrease in Namenda dose, as well as completion of a course of steroids for right lower extremity rash. Niece states that the rash has improved substantially from one month ago. No report of recent fever, chills, diarrhea, vomiting, fall.  Past Medical History  Diagnosis Date  . Osteoporosis   . Dry mouth     husband states she has issue with saliva glands and has to take medicaion fot hsi  . Forgetfulness    Past Surgical History  Procedure Laterality Date  . None     No family history on file. Social History  Substance Use Topics  . Smoking status: Never Smoker   . Smokeless tobacco: None  . Alcohol Use: No   OB History    No data available     Review of Systems  Unable to perform ROS: Dementia      Allergies  Keflex and Fosamax  Home Medications   Prior to Admission medications   Medication Sig Start Date End Date Taking? Authorizing Provider  aspirin 81 MG tablet Take 81 mg by mouth daily.    Historical Provider, MD  denosumab (PROLIA) 60 MG/ML SOLN injection Inject 60 mg into the skin every 6 (six) months. Administer in upper arm, thigh, or abdomen    Historical Provider, MD  desmopressin (DDAVP) 0.2 MG tablet Take 0.2 mg by mouth 2 (two) times daily.     Historical Provider, MD  donepezil (ARICEPT) 5 MG tablet Take 5 mg by mouth at bedtime.    Historical Provider, MD  ergocalciferol (VITAMIN D2) 50000 UNITS capsule Take 50,000 Units by mouth once a week.    Historical Provider, MD  levothyroxine (SYNTHROID, LEVOTHROID) 75 MCG tablet Take 75 mcg by mouth daily before breakfast.    Historical Provider, MD  Linaclotide (LINZESS) 290 MCG CAPS capsule Take 290 mcg by mouth daily before breakfast.    Historical Provider, MD  memantine (NAMENDA XR) 28 MG CP24 24 hr capsule Take 28 mg by mouth.    Historical Provider, MD  mometasone (ELOCON) 0.1 % cream Apply 1 application topically as needed.    Historical Provider, MD  naproxen sodium (ANAPROX) 220 MG tablet Take 220 mg by mouth as needed.    Historical Provider, MD  pravastatin (PRAVACHOL) 20 MG tablet Take 20 mg by mouth daily.    Historical Provider, MD   BP 118/70 mmHg  Pulse 122  Temp(Src) 97.5 F (36.4 C) (Rectal)  Resp 20  SpO2 93% Physical Exam  Constitutional:  Tremulous cachectic elderly female minimally verbal  HENT:  Head: Normocephalic and atraumatic.  Eyes: Conjunctivae and EOM are normal.  Cardiovascular: Regular rhythm.  Tachycardia present.   Pulmonary/Chest: Effort normal and breath sounds normal. No stridor. No respiratory distress.  Abdominal: She exhibits no distension.  Musculoskeletal: She exhibits no edema.  Neurological: She is alert.  No cranial nerve deficit.  Patient moves all extremity spontaneously, has mild ongoing tremor. Patient answers questions minimally, inconsistently, with very brief, inconsistent responses  Skin: Skin is warm and dry.  Psychiatric: Cognition and memory are impaired.  Nursing note and vitals reviewed.   ED Course  Procedures (including critical care time) Labs Review Labs Reviewed  COMPREHENSIVE METABOLIC PANEL - Abnormal; Notable for the following:    Sodium 174 (*)    Chloride >130 (*)    Glucose, Bld 134 (*)    BUN 35 (*)     Creatinine, Ser 3.41 (*)    AST 45 (*)    GFR calc non Af Amer 12 (*)    GFR calc Af Amer 14 (*)    All other components within normal limits  CBC - Abnormal; Notable for the following:    WBC 14.5 (*)    Hemoglobin 16.1 (*)    HCT 52.8 (*)    MCV 106.5 (*)    All other components within normal limits  PROTIME-INR - Abnormal; Notable for the following:    Prothrombin Time 15.6 (*)    All other components within normal limits  URINALYSIS, ROUTINE W REFLEX MICROSCOPIC (NOT AT [facility])  CBG MONITORING, ED    Imaging Review Dg Chest Port 1 View  11/20/2015  CLINICAL DATA:  Altered level of consciousness, hypernatremia, tachycardia, confusion, history dementia EXAM: PORTABLE CHEST 1 VIEW COMPARISON:  Portable exam 2050 hours compared to 02/21/2015 FINDINGS: Normal heart size, mediastinal contours, and pulmonary vascularity. Minimal atelectasis at RIGHT base. Lungs otherwise clear. No pleural effusion or pneumothorax. Bones demineralized. IMPRESSION: Minimal RIGHT basilar atelectasis. Electronically Signed   By: Ulyses Southward M.D.   On: 11/20/2015 21:09   I have personally reviewed and evaluated these images and lab results as part of my medical decision-making.   EKG Interpretation   Date/Time:  Monday November 20 2015 20:27:49 EDT Ventricular Rate:  110 PR Interval:  102 QRS Duration: 89 QT Interval:  336 QTC Calculation: 454 R Axis:   45 Text Interpretation:  Sinus tachycardia RSR' in V1 or V2, right VCD or RVH  Repol abnrm, severe global ischemia (LM/MVD) Sinus tachycardia Artifact T  wave abnormality Abnormal ekg Confirmed by Gerhard Munch  MD [phone]) on  11/20/2015 8:34:51 PM     Cardiac monitor 110 sinus tach abnormal Pulse oximetry 100% room air normal   Patient's initial labs notable for hypernatremia, 174, hyperchloremia, undetectable levels, as well as elevated creatinine level, 3.41. Patient has no history of renal dysfunction. Patient continues to receive fluid  resuscitation.   10:58 PM Remainder of patient's labs unremarkable, mild leukocytosis. X-ray unremarkable. On repeat exam terminates in similar condition, hypersomnolent. I discussed patient's case with our admitting team. MDM  Elderly female presents from nursing facility with familial concerns of decreased interactivity. The patient herself is incapable of providing any details of her illness. Here, the patient is tachycardic, but in no distress, but again, essentially non-interactive. Patient is found to have notable lab abnormalities, including sodium greater than 170, chloride undetectably high.  Creatinine 3.4 Patient received initial fluid resuscitation in the emergency department, but with her critical lab abnormalities, she required admission for further evaluation and management.  CRITICAL CARE Performed by: Gerhard Munch Total critical care time: 40 minutes Critical care time was exclusive of separately billable procedures and treating other patients. Critical care was necessary to treat or prevent imminent or life-threatening deterioration. Critical care was time spent personally by me on the  following activities: development of treatment plan with patient and/or surrogate as well as nursing, discussions with consultants, evaluation of patient's response to treatment, examination of patient, obtaining history from patient or surrogate, ordering and performing treatments and interventions, ordering and review of laboratory studies, ordering and review of radiographic studies, pulse oximetry and re-evaluation of patient's condition.   Gerhard Munch, MD 11/21/15 [phone]

## 2015-11-20 NOTE — H&P (Signed)
History and Physical  Patient Name: Christina Garcia     OEV:[id number]    DOB: [birth year]    DOA: 11/20/2015 Referring physician: Adalberto Cole, MD PCP: Garlan Fillers, MD      Chief Complaint: Confusion and weakness  HPI: Christina Garcia is a 82 y.o. female with a past medical history significant for diabetes insipidus on desmopressin and dementia who presents with confusion and hypernatremia.  All history is collected from the patient's nephew and niece, as the patient is unable to provide her own history.  They report that the patient was in her usual state of health until about three days ago when they noticed she was eating less than usual.  2 days ago she seemed "weepy" and more fidgety than usual, and staff at her facility noticed she was walking around late at night. Today the family found her confused and sluggish, and had her brought to the ER.  In the ED, the patient was found to have sodium 174, creatinine 2.41, K4.3.  An ECG showed sinus tachycardia with diffuse ST depressions.  CXR clear.  She was given a 1L NS bolus and TRH were called for admission.  At baseline she lives in ALF, independent with ADLs, dementia such that she knows some family members but not others, and repeats herself in conversations, does not use a cane or walker.   Review of Systems:  Unable to obtain due to patient mentation.  Allergies  Allergen Reactions  . Keflex [Cephalexin] Other (See Comments)    Transcribed from office notes faxed to short stay as "critical"  . Fosamax [Alendronate Sodium] Other (See Comments)    Transcribed from office notes as "critical"    Prior to Admission medications   Medication Sig Start Date End Date Taking? Authorizing Provider  acetaminophen (TYLENOL) 500 MG tablet Take 1,000 mg by mouth 2 (two) times daily as needed for mild pain.   Yes Historical Provider, MD  aspirin 81 MG tablet Take 81 mg by mouth daily.   Yes Historical Provider, MD  desmopressin  (DDAVP) 0.2 MG tablet Take 0.2 mg by mouth 2 (two) times daily.   Yes Historical Provider, MD  divalproex (DEPAKOTE) 125 MG DR tablet Take 125 mg by mouth every evening.   Yes Historical Provider, MD  donepezil (ARICEPT) 5 MG tablet Take 5 mg by mouth at bedtime.   Yes Historical Provider, MD  ergocalciferol (VITAMIN D2) 50000 UNITS capsule Take 50,000 Units by mouth once a week.   Yes Historical Provider, MD  ferrous sulfate 325 (65 FE) MG tablet Take 325 mg by mouth daily with breakfast.   Yes Historical Provider, MD  levothyroxine (SYNTHROID, LEVOTHROID) 75 MCG tablet Take 75 mcg by mouth daily before breakfast.   Yes Historical Provider, MD  Linaclotide (LINZESS) 145 MCG CAPS capsule Take 145 mcg by mouth daily.   Yes Historical Provider, MD  memantine (NAMENDA) 10 MG tablet Take 10 mg by mouth daily.   Yes Historical Provider, MD  Multiple Vitamin (MULTI VITAMIN PO) Take 1 tablet by mouth daily.   Yes Historical Provider, MD  naproxen sodium (ANAPROX) 220 MG tablet Take 220 mg by mouth as needed (general pain).    Yes Historical Provider, MD  pravastatin (PRAVACHOL) 20 MG tablet Take 20 mg by mouth daily.   Yes Historical Provider, MD  predniSONE (DELTASONE) 20 MG tablet Take 20 mg by mouth daily.   Yes Historical Provider, MD  Probiotic Product (PROBIOTIC PO) Take 1 capsule by mouth  daily.   Yes Historical Provider, MD  traZODone (DESYREL) 50 MG tablet Take 50 mg by mouth at bedtime.   Yes Historical Provider, MD    Past Medical History  Diagnosis Date  . Osteoporosis   . Dry mouth     husband states she has issue with saliva glands and has to take medicaion fot hsi  . Forgetfulness   . Hypothyroidism   . Diabetes insipidus [facility])     Past Surgical History  Procedure Laterality Date  . None      Family history: family history includes Alzheimer's disease in her brother and mother.  Social History: Patient lives in ALF.  She does not use a cane or walker.  At baseline, she can  remember some family members, not others, repeats herself in conversation.  Her nephew is her POA.       Physical Exam: BP 115/72 mmHg  Pulse 104  Temp(Src) 97.5 F (36.4 C) (Rectal)  Resp 17  SpO2 95% General appearance: Frail elderly  female.  Eyes open and moaning, but unable to answer questions purposefully or follow commands reliably.   Eyes: Anicteric, conjunctiva pink, lids and lashes normal.     ENT: No nasal deformity, discharge, or epistaxis.  OP dry without lesions.   Lymph: No cervical or supraclavicular lymphadenopathy. Skin: Warm and dry.  No jaundice.  Faint fading rash on left lower extremity. Cardiac: Tachycardic, regular, nl S1-S2, no murmurs appreciated.   Respiratory: Normal respiratory rate and rhythm.  CTAB without rales or wheezes. Abdomen: Abdomen soft without rigidity.  No ascites, distension.   MSK: No deformities or effusions. Neuro: Pupils equal and reactive.  Opens mouth and follows simple commands, but not reliably.  No focal weakness.  Mild tremor.    Psych: Unable to assess due to patient mentation.       Labs on Admission:  The metabolic panel shows hyponatremia, hyperchloremia, AKI. AST is minimally elevated. INR is normal. The complete blood count shows leukocytosis without anemia or thrombocytopenia.   Radiological Exams on Admission: Personally reviewed: Dg Chest Port 1 View  11/20/2015  CLINICAL DATA:  Altered level of consciousness, hypernatremia, tachycardia, confusion, history dementia EXAM: PORTABLE CHEST 1 VIEW COMPARISON:  Portable exam 2050 hours compared to 02/21/2015 FINDINGS: Normal heart size, mediastinal contours, and pulmonary vascularity. Minimal atelectasis at RIGHT base. Lungs otherwise clear. No pleural effusion or pneumothorax. Bones demineralized. IMPRESSION: Minimal RIGHT basilar atelectasis. Electronically Signed   By: Ulyses Southward M.D.   On: 11/20/2015 21:09    EKG: Independently reviewed. Sinus tachycardia, diffuse ST  depressions small.    Assessment/Plan 1. Hypernatremia with hx of diabetes insipidus:  This is new.   GCS 11 at admission.  Calculated free water deficit 6.6L.   -D5 at 250 cc/hr, goal correction to 165 by tomorrow night -BMP q6hrs -Continue desmopressin as IV form, 2 mcg BID for now   2. AKI:  This is new.  Likely from dehydration. -Check urine electrolytes -Fluids as above  3. Hypothyroidism:  -Restart levothyroxine when able to take PO -Check TSH  4. Dementia with behavioral disturbance:  -Hold home depakote, Namenda, and donepezil  5. Transaminitis:  Likely from #1.   -Repeat AST before discharge  6. Leg rash:  -Hold prednisone, given inability to take PO      DVT PPx: Lovenox Diet: Regular as able Consultants: Nephrology Code Status: DO NOT RESUSCITATE Family Communication: Nephew and niece at bedside.  Overnight plan discussed and CODE STATUS confirmed.  Medical decision making: What exists of the patient's previous chart was reviewed in depth and the case was discussed with Dr. Jeraldine Loots and Dr. Lowell Guitar from Nephrology. Patient seen 11:13 PM on 11/20/2015.  Disposition Plan:  I recommend admission to telemetry.  Clinical condition: gaurded given AKI and degree of hypernatremia.  Anticipate slow correction of sodium and close monitoring of electrolytes.  Renal consultation.  Dispo pending further evaluation.      Alberteen Sam [facility] Pager [phone]

## 2015-11-21 ENCOUNTER — Inpatient Hospital Stay (HOSPITAL_COMMUNITY): Payer: PPO

## 2015-11-21 DIAGNOSIS — E038 Other specified hypothyroidism: Secondary | ICD-10-CM | POA: Diagnosis not present

## 2015-11-21 DIAGNOSIS — E876 Hypokalemia: Secondary | ICD-10-CM

## 2015-11-21 DIAGNOSIS — E870 Hyperosmolality and hypernatremia: Secondary | ICD-10-CM | POA: Diagnosis not present

## 2015-11-21 DIAGNOSIS — F0391 Unspecified dementia with behavioral disturbance: Secondary | ICD-10-CM | POA: Diagnosis not present

## 2015-11-21 DIAGNOSIS — E232 Diabetes insipidus: Secondary | ICD-10-CM | POA: Diagnosis not present

## 2015-11-21 DIAGNOSIS — R7989 Other specified abnormal findings of blood chemistry: Secondary | ICD-10-CM | POA: Diagnosis not present

## 2015-11-21 LAB — BASIC METABOLIC PANEL
Anion gap: 12 (ref 5–15)
Anion gap: 12 (ref 5–15)
Anion gap: 8 (ref 5–15)
Anion gap: 6 (ref 5–15)
Anion gap: 9 (ref 5–15)
BUN: 36 mg/dL — AB (ref 6–20)
BUN: 38 mg/dL — AB (ref 6–20)
BUN: 32 mg/dL — AB (ref 6–20)
BUN: 27 mg/dL — AB (ref 6–20)
BUN: 26 mg/dL — AB (ref 6–20)
CALCIUM: 8.4 mg/dL — AB (ref 8.9–10.3)
CALCIUM: 8.3 mg/dL — AB (ref 8.9–10.3)
CHLORIDE: 129 mmol/L — AB (ref 101–111)
CO2: 30 mmol/L (ref 22–32)
CO2: 26 mmol/L (ref 22–32)
CO2: 30 mmol/L (ref 22–32)
CO2: 30 mmol/L (ref 22–32)
CO2: 26 mmol/L (ref 22–32)
CREATININE: 1.82 mg/dL — AB (ref 0.44–1.00)
CREATININE: 1.62 mg/dL — AB (ref 0.44–1.00)
CREATININE: 1.44 mg/dL — AB (ref 0.44–1.00)
Calcium: 9.6 mg/dL (ref 8.9–10.3)
Calcium: 8.9 mg/dL (ref 8.9–10.3)
Calcium: 8.6 mg/dL — ABNORMAL LOW (ref 8.9–10.3)
Chloride: 123 mmol/L — ABNORMAL HIGH (ref 101–111)
Chloride: 117 mmol/L — ABNORMAL HIGH (ref 101–111)
Chloride: 113 mmol/L — ABNORMAL HIGH (ref 101–111)
Chloride: 112 mmol/L — ABNORMAL HIGH (ref 101–111)
Creatinine, Ser: 3.36 mg/dL — ABNORMAL HIGH (ref 0.44–1.00)
Creatinine, Ser: 2.57 mg/dL — ABNORMAL HIGH (ref 0.44–1.00)
GFR calc Af Amer: 14 mL/min — ABNORMAL LOW (ref >60)
GFR calc Af Amer: 19 mL/min — ABNORMAL LOW (ref >60)
GFR calc Af Amer: 29 mL/min — ABNORMAL LOW (ref >60)
GFR calc Af Amer: 33 mL/min — ABNORMAL LOW (ref >60)
GFR calc Af Amer: 38 mL/min — ABNORMAL LOW (ref >60)
GFR calc non Af Amer: 12 mL/min — ABNORMAL LOW (ref >60)
GFR, EST NON AFRICAN AMERICAN: 16 mL/min — AB (ref >60)
GFR, EST NON AFRICAN AMERICAN: 25 mL/min — AB (ref >60)
GFR, EST NON AFRICAN AMERICAN: 29 mL/min — AB (ref >60)
GFR, EST NON AFRICAN AMERICAN: 33 mL/min — AB (ref >60)
GLUCOSE: 214 mg/dL — AB (ref 65–99)
Glucose, Bld: 147 mg/dL — ABNORMAL HIGH (ref 65–99)
Glucose, Bld: 156 mg/dL — ABNORMAL HIGH (ref 65–99)
Glucose, Bld: 151 mg/dL — ABNORMAL HIGH (ref 65–99)
Glucose, Bld: 130 mg/dL — ABNORMAL HIGH (ref 65–99)
POTASSIUM: 3.6 mmol/L (ref 3.5–5.1)
POTASSIUM: 3.4 mmol/L — AB (ref 3.5–5.1)
POTASSIUM: 2.9 mmol/L — AB (ref 3.5–5.1)
POTASSIUM: 3.1 mmol/L — AB (ref 3.5–5.1)
Potassium: 3.8 mmol/L (ref 3.5–5.1)
SODIUM: 171 mmol/L — AB (ref 135–145)
SODIUM: 161 mmol/L — AB (ref 135–145)
SODIUM: 155 mmol/L — AB (ref 135–145)
SODIUM: 149 mmol/L — AB (ref 135–145)
SODIUM: 147 mmol/L — AB (ref 135–145)

## 2015-11-21 LAB — OSMOLALITY, URINE: Osmolality, Ur: 332 mOsm/kg (ref 300–900)

## 2015-11-21 LAB — TSH: TSH: 2.899 u[IU]/mL (ref 0.350–4.500)

## 2015-11-21 LAB — FOLATE: FOLATE: 26.6 ng/mL (ref >5.9)

## 2015-11-21 LAB — CBC
HEMATOCRIT: 53.4 % — AB (ref 36.0–46.0)
Hemoglobin: 15.5 g/dL — ABNORMAL HIGH (ref 12.0–15.0)
MCH: 31.2 pg (ref 26.0–34.0)
MCHC: 29 g/dL — AB (ref 30.0–36.0)
MCV: 107.4 fL — AB (ref 78.0–100.0)
Platelets: 251 10*3/uL (ref 150–400)
RBC: 4.97 MIL/uL (ref 3.87–5.11)
RDW: 14.2 % (ref 11.5–15.5)
WBC: 12.1 10*3/uL — AB (ref 4.0–10.5)

## 2015-11-21 LAB — CBG MONITORING, ED: Glucose-Capillary: 139 mg/dL — ABNORMAL HIGH (ref 65–99)

## 2015-11-21 LAB — SODIUM, URINE, RANDOM: Sodium, Ur: 18 mmol/L

## 2015-11-21 LAB — VITAMIN B12: Vitamin B-12: 1102 pg/mL — ABNORMAL HIGH (ref 180–914)

## 2015-11-21 LAB — MRSA PCR SCREENING: MRSA BY PCR: NEGATIVE

## 2015-11-21 LAB — CREATININE, URINE, RANDOM: CREATININE, URINE: 210.16 mg/dL

## 2015-11-21 LAB — OSMOLALITY: Osmolality: 381 mOsm/kg (ref 275–295)

## 2015-11-21 MED ORDER — KCL-LACTATED RINGERS-D5W 20 MEQ/L IV SOLN
INTRAVENOUS | Status: DC
  Administered 2015-11-21 – 2015-11-22 (×2): via INTRAVENOUS
  Filled 2015-11-21 (×7): qty 1000

## 2015-11-21 MED ORDER — DEXTROSE 5 % IV SOLN
500.0000 mg | Freq: Three times a day (TID) | INTRAVENOUS | Status: DC
  Administered 2015-11-21 – 2015-11-22 (×4): 500 mg via INTRAVENOUS
  Filled 2015-11-21 (×9): qty 0.5

## 2015-11-21 MED ORDER — LEVOTHYROXINE SODIUM 75 MCG PO TABS
75.0000 ug | ORAL_TABLET | Freq: Every day | ORAL | Status: DC
  Administered 2015-11-22 – 2015-11-23 (×2): 75 ug via ORAL
  Filled 2015-11-21 (×2): qty 1

## 2015-11-21 MED ORDER — MEMANTINE HCL 10 MG PO TABS
10.0000 mg | ORAL_TABLET | Freq: Every day | ORAL | Status: DC
  Administered 2015-11-21 – 2015-11-23 (×3): 10 mg via ORAL
  Filled 2015-11-21 (×3): qty 1

## 2015-11-21 MED ORDER — DIVALPROEX SODIUM 125 MG PO DR TAB
125.0000 mg | DELAYED_RELEASE_TABLET | Freq: Every evening | ORAL | Status: DC
  Administered 2015-11-22: 125 mg via ORAL
  Filled 2015-11-21 (×3): qty 1

## 2015-11-21 MED ORDER — DESMOPRESSIN ACETATE 4 MCG/ML IJ SOLN
2.0000 ug | Freq: Two times a day (BID) | INTRAMUSCULAR | Status: DC
  Administered 2015-11-21 – 2015-11-22 (×2): 2 ug via INTRAVENOUS
  Filled 2015-11-21: qty 0.5
  Filled 2015-11-21: qty 1
  Filled 2015-11-21: qty 0.5

## 2015-11-21 MED ORDER — TRAZODONE HCL 50 MG PO TABS
50.0000 mg | ORAL_TABLET | Freq: Every day | ORAL | Status: DC
Start: 2015-11-21 — End: 2015-11-23
  Administered 2015-11-21 – 2015-11-22 (×2): 50 mg via ORAL
  Filled 2015-11-21 (×2): qty 1

## 2015-11-21 MED ORDER — PREDNISONE 20 MG PO TABS
20.0000 mg | ORAL_TABLET | Freq: Every day | ORAL | Status: DC
  Administered 2015-11-21 – 2015-11-23 (×3): 20 mg via ORAL
  Filled 2015-11-21 (×3): qty 1

## 2015-11-21 MED ORDER — DONEPEZIL HCL 5 MG PO TABS
5.0000 mg | ORAL_TABLET | Freq: Every day | ORAL | Status: DC
  Administered 2015-11-21 – 2015-11-22 (×2): 5 mg via ORAL
  Filled 2015-11-21 (×2): qty 1

## 2015-11-21 MED ORDER — PRAVASTATIN SODIUM 20 MG PO TABS
20.0000 mg | ORAL_TABLET | Freq: Every day | ORAL | Status: DC
  Administered 2015-11-21 – 2015-11-22 (×2): 20 mg via ORAL
  Filled 2015-11-21 (×2): qty 1

## 2015-11-21 MED ORDER — ASPIRIN EC 81 MG PO TBEC
81.0000 mg | DELAYED_RELEASE_TABLET | Freq: Every day | ORAL | Status: DC
  Administered 2015-11-21 – 2015-11-23 (×3): 81 mg via ORAL
  Filled 2015-11-21 (×4): qty 1

## 2015-11-21 MED ORDER — POTASSIUM CHLORIDE CRYS ER 20 MEQ PO TBCR
40.0000 meq | EXTENDED_RELEASE_TABLET | ORAL | Status: AC
  Administered 2015-11-21 (×2): 40 meq via ORAL
  Filled 2015-11-21 (×2): qty 2

## 2015-11-21 NOTE — Progress Notes (Signed)
NURSING PROGRESS NOTE  Christina Garcia [id number] Admission Data: 11/21/2015 1:52 AM Attending Provider: Alberteen Sam, MD LDJ:[id number],JQZESP G, MD Code Status: DNR  Allergies:  Keflex and Fosamax Past Medical History:   has a past medical history of Osteoporosis; Dry mouth; Forgetfulness; Hypothyroidism; and Diabetes insipidus (HCC). Past Surgical History:   has past surgical history that includes none. Social History:   reports that she has never smoked. She does not have any smokeless tobacco history on file. She reports that she does not drink alcohol or use illicit drugs.  Christina Garcia is a 82 y.o. female patient admitted from ED:   Last Documented Vital Signs: Blood pressure 123/63, pulse 94, temperature 98.6 F (37 C), temperature source Oral, resp. rate 20, weight 46.04 kg (101 lb 8 oz), SpO2 99 %.  Cardiac Monitoring: Box # 16 in place. Cardiac monitor yields:normal sinus rhythm.  IV Fluids:  IV in place, occlusive dsg intact without redness, IV cath hand left, condition patent and no redness D5.   Skin: Intact with bruise on upper right arm and bilateral lower leg redness.  Patient orientated to room. Information packet given to patient. Admission inpatient armband information verified with patient to include name and date of birth, which patient could not state, and placed on patient arm. Side rails up x 2, fall assessment and education completed with patient. Patient unable to verbalize understanding of risk associated with falls and  did not verbalized understanding to call for assistance before getting out of bed. Call light within reach. Patient unable to voice and demonstrate understanding of unit orientation instructions. Patient is very confused at this time.   Will continue to evaluate and treat per MD orders.  Sue Lush RN, BSN

## 2015-11-21 NOTE — Clinical Social Work Note (Signed)
Clinical Social Work Assessment  Patient Details  Name: Christina Garcia MRN: [id number] Date of Birth: 12/11/1932  Date of referral:  11/21/15               Reason for consult:  Facility Placement                Permission sought to share information with:  Facility Medical sales representative, Family Supports Permission granted to share information::  No (Patient is disoriented; Completed assessment with [name].)  Name::     Christina Garcia  Agency::  [facility]  Relationship::  Niece  Contact Information:  [phone]  Housing/Transportation Living arrangements for the past 2 months:  Assisted Living Facility Source of Information:  Other (Comment Required), Power of Attorney (Niece) Patient Interpreter Needed:  None Criminal Activity/Legal Involvement Pertinent to Current Situation/Hospitalization:  No - Comment as needed Significant Relationships:  Other Family Members Lives with:  Self Do you feel safe going back to the place where you live?  Yes Need for family participation in patient care:  Yes (Comment)  Care giving concerns:  CSW consulted regarding discharge plan. Patient is disoriented. CSW spoke with patient's niece, Christina Garcia. She stated that patient is from [facility]. The discharge plan is to return there as long as patient does not require SNF.  Social Worker assessment / plan:  Patient to return to [facility].   Employment status:  Retired Health and safety inspector:  Managed Care PT Recommendations:  Not assessed at this time Information / Referral to community resources:     Patient/Family's Response to care:  Patient's niece stated that patient is normally very mobile and active at the ALF and she hopes patient can return there. Christina Garcia is willing to take patient back to ALF by car.  Patient/Family's Understanding of and Emotional Response to Diagnosis, Current Treatment, and Prognosis:  No questions/concerns.  Emotional Assessment Appearance:  Appears  stated age Attitude/Demeanor/Rapport:  Unable to Assess Affect (typically observed):  Unable to Assess Orientation:  Oriented to Self Alcohol / Substance use:  Not Applicable Psych involvement (Current and /or in the community):  No (Comment)  Discharge Needs  Concerns to be addressed:  Care Coordination Readmission within the last 30 days:  No Current discharge risk:  None Barriers to Discharge:  Continued Medical Work up   [employer], LCSWA 11/21/2015, 11:52 AM

## 2015-11-21 NOTE — Progress Notes (Signed)
CRITICAL VALUE ALERT  Critical value received:  Serum Osmol 381  Date of notification:  11/21/2015  Time of notification:  0243  Critical value read back:Yes.    Nurse who received alert:  Sue Lush RN  MD notified (1st page):  Merdis Delay, NP  Time of first page:  0246  MD notified (2nd page): K.Schorr, NP  Time of second page: 0327  Responding MD:  Merdis Delay, NP  Time MD responded:  [phone]

## 2015-11-21 NOTE — Progress Notes (Signed)
ANTIBIOTIC CONSULT NOTE - INITIAL  Pharmacy Consult for Aztreonam Indication: UTI  Allergies  Allergen Reactions  . Keflex [Cephalexin] Other (See Comments)    Transcribed from office notes faxed to short stay as "critical"  . Fosamax [Alendronate Sodium] Other (See Comments)    Transcribed from office notes as "critical"    Patient Measurements: Weight: 101 lb 8 oz (46.04 kg) Adjusted Body Weight:   Vital Signs: Temp: 98.2 F (36.8 C) (03/28 0538) Temp Source: Oral (03/28 0538) BP: 126/70 mmHg (03/28 0538) Pulse Rate: 96 (03/28 0538) Intake/Output from previous day: 03/27 0701 - 03/28 0700 In: 1404.2 [I.V.:1404.2] Out: -  Intake/Output from this shift:    Labs:  Recent Labs  11/20/15 2050 11/20/15 2310 11/21/15 0158 11/21/15 0924  WBC 14.5*  --  12.1*  --   HGB 16.1*  --  15.5*  --   PLT 290  --  251  --   LABCREA  --  210.16  --   --   CREATININE 3.41*  --  3.36* 2.57*   CrCl cannot be calculated (Unknown ideal weight.). No results for input(s): VANCOTROUGH, VANCOPEAK, VANCORANDOM, GENTTROUGH, GENTPEAK, GENTRANDOM, TOBRATROUGH, TOBRAPEAK, TOBRARND, AMIKACINPEAK, AMIKACINTROU, AMIKACIN in the last 72 hours.   Microbiology: Recent Results (from the past 720 hour(s))  MRSA PCR Screening     Status: None   Collection Time: 11/21/15  1:35 AM  Result Value Ref Range Status   MRSA by PCR NEGATIVE NEGATIVE Final    Comment:        The GeneXpert MRSA Assay (FDA approved for NASAL specimens only), is one component of a comprehensive MRSA colonization surveillance program. It is not intended to diagnose MRSA infection nor to guide or monitor treatment for MRSA infections.     Medical History: Past Medical History  Diagnosis Date  . Osteoporosis   . Dry mouth     husband states she has issue with saliva glands and has to take medicaion fot hsi  . Forgetfulness   . Hypothyroidism   . Diabetes insipidus (HCC)     Medications:  Scheduled:  . aztreonam   500 mg Intravenous 3 times per day  . desmopressin  2 mcg Intravenous BID  . enoxaparin (LOVENOX) injection  30 mg Subcutaneous Q24H  . sodium chloride flush  3 mL Intravenous Q12H   Assessment: 82yo female with AMS and dirty urine, culture has been ordered but no results.  To start Aztreonam due to Cephalosporin allergy.  CrCl ~15  Goal of Therapy:  Treatment of UTI  Plan:  Aztreonam 500mg  IV q8 F/U Urine Cx D/C antibiotic if cx negative  , PharmD Clinical Pharmacist [facility]

## 2015-11-21 NOTE — Progress Notes (Addendum)
CRITICAL VALUE ALERT  Critical value received:  Sodium 171  Date of notification:  11/21/2015  Time of notification:  0310  Critical value read back:Yes.    Nurse who received alert:  Bertram Denver  MD notified (1st page):  K.Schorr, NP  Time of first page:  0311  MD notified (2nd page): Merdis Delay, NP  Time of second page: 0327  Responding MD: Merdis Delay, NP  Time MD responded:  [phone]

## 2015-11-21 NOTE — Progress Notes (Signed)
Received report from ED RN, Emilie.

## 2015-11-21 NOTE — Progress Notes (Signed)
Pt very anxious this am. Pt fidgety in bed. Niece at bedside & is requesting medication to help her calm down. Will continue to monitor pt. Bed remains in lowest position, call bell is within reach. Bed alarm remains on.

## 2015-11-21 NOTE — Progress Notes (Addendum)
PROGRESS NOTE  Christina Garcia VWP:[id number] DOB: 30-Oct-1932 DOA: 11/20/2015 PCP: Garlan Fillers, MD  HPI/Recap of past 24 hours:  Pleasantly demented, follow commands inconsistently  Assessment/Plan: Principal Problem:   Hypernatremia Active Problems:   AKI (acute kidney injury) (HCC)   Diabetes insipidus (HCC)   Dementia   Hypothyroidism  Confusion, likely metabolic encephalopathy with hypernatremia, + /- uti.   Hypernatremia/h/o diabetes insipidus: better on ddavp and d5, check bmp q4hrs, better Addendum: na correction rapidly, clinically very dehydrated, will change ivf from d5 250cc/hr  to d5 LR with k at 75cc/hr.  Hypokalemia: replace k  ARF: likely from dehydration , possible uti, urine culture pending, start abx empirically, h/o keflex allergy and now with renal failure, will use aztreonam for now, renal US no hydro  Mild elevation of lft, repeat in am  Hypothyroidism:  -Restarted levothyroxine -TSH wnl  Dementia with behavioral disturbance:  -restart depakote, Namenda, and donepezil  Leg rash:  -restart home dose prednisone  Code Status: DNR  Family Communication: patient   Disposition Plan: pending, eventually back to SNF   Consultants:  Phone conversation with nephrology by admitting MD  Procedures:  none  Antibiotics:  azetronam from 3/28   Objective: BP 126/70 mmHg  Pulse 96  Temp(Src) 98.2 F (36.8 C) (Oral)  Resp 20  Wt 46.04 kg (101 lb 8 oz)  SpO2 97%  Intake/Output Summary (Last 24 hours) at 11/21/15 1137 Last data filed at 11/21/15 3748  Gross per 24 hour  Intake 1404.17 ml  Output      0 ml  Net 1404.17 ml   Filed Weights   11/21/15 0120  Weight: 46.04 kg (101 lb 8 oz)    Exam:   General:  Pleasantly confused, does follow commands, very thin, underweight  Cardiovascular: RRR  Respiratory: CTABL  Abdomen: Soft/ND/NT, positive BS  Musculoskeletal: No Edema  Neuro: Pleasantly confused, does follow  commands, moving all extremities  Data Reviewed: Basic Metabolic Panel:  Recent Labs Lab 11/20/15 2050 11/21/15 0158 11/21/15 0924  NA 174* 171* 161*  K 4.3 3.6 3.4*  CL >130* 129* 123*  CO2 25 30 26   GLUCOSE 134* 214* 147*  BUN 35* 36* 38*  CREATININE 3.41* 3.36* 2.57*  CALCIUM 10.2 9.6 8.9   Liver Function Tests:  Recent Labs Lab 11/20/15 2050  AST 45*  ALT 28  ALKPHOS 78  BILITOT 0.9  PROT 8.0  ALBUMIN 4.1   No results for input(s): LIPASE, AMYLASE in the last 168 hours. No results for input(s): AMMONIA in the last 168 hours. CBC:  Recent Labs Lab 11/20/15 2050 11/21/15 0158  WBC 14.5* 12.1*  HGB 16.1* 15.5*  HCT 52.8* 53.4*  MCV 106.5* 107.4*  PLT 290 251   Cardiac Enzymes:   No results for input(s): CKTOTAL, CKMB, CKMBINDEX, TROPONINI in the last 168 hours. BNP (last 3 results)  Recent Labs  02/21/15 1842  BNP 43.6    ProBNP (last 3 results) No results for input(s): PROBNP in the last 8760 hours.  CBG:  Recent Labs Lab 11/21/15 0037  GLUCAP 139*    Recent Results (from the past 240 hour(s))  MRSA PCR Screening     Status: None   Collection Time: 11/21/15  1:35 AM  Result Value Ref Range Status   MRSA by PCR NEGATIVE NEGATIVE Final    Comment:        The GeneXpert MRSA Assay (FDA approved for NASAL specimens only), is one component of a comprehensive MRSA colonization  surveillance program. It is not intended to diagnose MRSA infection nor to guide or monitor treatment for MRSA infections.      Studies: Dg Chest Port 1 View  11/20/2015  CLINICAL DATA:  Altered level of consciousness, hypernatremia, tachycardia, confusion, history dementia EXAM: PORTABLE CHEST 1 VIEW COMPARISON:  Portable exam 2050 hours compared to 02/21/2015 FINDINGS: Normal heart size, mediastinal contours, and pulmonary vascularity. Minimal atelectasis at RIGHT base. Lungs otherwise clear. No pleural effusion or pneumothorax. Bones demineralized. IMPRESSION:  Minimal RIGHT basilar atelectasis. Electronically Signed   By: Ulyses Southward M.D.   On: 11/20/2015 21:09    Scheduled Meds: . desmopressin  2 mcg Intravenous BID  . enoxaparin (LOVENOX) injection  30 mg Subcutaneous Q24H  . sodium chloride flush  3 mL Intravenous Q12H    Continuous Infusions: . dextrose 250 mL/hr at 11/21/15 0441     Time spent:  Tesneem Dufrane MD, PhD  [facility] Pager [phone]. If 7PM-7AM, please contact night-coverage at [email or web address], password [facility] 11/21/2015, 11:37 AM  LOS: 1 day

## 2015-11-22 DIAGNOSIS — N179 Acute kidney failure, unspecified: Principal | ICD-10-CM

## 2015-11-22 DIAGNOSIS — F0391 Unspecified dementia with behavioral disturbance: Secondary | ICD-10-CM

## 2015-11-22 DIAGNOSIS — E870 Hyperosmolality and hypernatremia: Secondary | ICD-10-CM | POA: Diagnosis not present

## 2015-11-22 DIAGNOSIS — R748 Abnormal levels of other serum enzymes: Secondary | ICD-10-CM | POA: Diagnosis not present

## 2015-11-22 LAB — BASIC METABOLIC PANEL
ANION GAP: 7 (ref 5–15)
ANION GAP: 8 (ref 5–15)
BUN: 29 mg/dL — ABNORMAL HIGH (ref 6–20)
BUN: 27 mg/dL — ABNORMAL HIGH (ref 6–20)
CALCIUM: 8.4 mg/dL — AB (ref 8.9–10.3)
CHLORIDE: 114 mmol/L — AB (ref 101–111)
CO2: 25 mmol/L (ref 22–32)
CO2: 25 mmol/L (ref 22–32)
Calcium: 8.7 mg/dL — ABNORMAL LOW (ref 8.9–10.3)
Chloride: 116 mmol/L — ABNORMAL HIGH (ref 101–111)
Creatinine, Ser: 1.33 mg/dL — ABNORMAL HIGH (ref 0.44–1.00)
Creatinine, Ser: 1.12 mg/dL — ABNORMAL HIGH (ref 0.44–1.00)
GFR calc Af Amer: 42 mL/min — ABNORMAL LOW (ref >60)
GFR calc non Af Amer: 45 mL/min — ABNORMAL LOW (ref >60)
GFR, EST AFRICAN AMERICAN: 52 mL/min — AB (ref >60)
GFR, EST NON AFRICAN AMERICAN: 36 mL/min — AB (ref >60)
GLUCOSE: 194 mg/dL — AB (ref 65–99)
Glucose, Bld: 94 mg/dL (ref 65–99)
Potassium: 5.3 mmol/L — ABNORMAL HIGH (ref 3.5–5.1)
Potassium: 4.8 mmol/L (ref 3.5–5.1)
SODIUM: 148 mmol/L — AB (ref 135–145)
Sodium: 147 mmol/L — ABNORMAL HIGH (ref 135–145)

## 2015-11-22 LAB — CBC
HCT: 39.8 % (ref 36.0–46.0)
HEMOGLOBIN: 12.2 g/dL (ref 12.0–15.0)
MCH: 31.4 pg (ref 26.0–34.0)
MCHC: 30.7 g/dL (ref 30.0–36.0)
MCV: 102.3 fL — ABNORMAL HIGH (ref 78.0–100.0)
Platelets: 192 10*3/uL (ref 150–400)
RBC: 3.89 MIL/uL (ref 3.87–5.11)
RDW: 13.8 % (ref 11.5–15.5)
WBC: 9.2 10*3/uL (ref 4.0–10.5)

## 2015-11-22 LAB — COMPREHENSIVE METABOLIC PANEL
ALBUMIN: 3.2 g/dL — AB (ref 3.5–5.0)
ALK PHOS: 68 U/L (ref 38–126)
ALT: 33 U/L (ref 14–54)
ANION GAP: 8 (ref 5–15)
AST: 51 U/L — ABNORMAL HIGH (ref 15–41)
BUN: 28 mg/dL — ABNORMAL HIGH (ref 6–20)
CO2: 25 mmol/L (ref 22–32)
Calcium: 8.9 mg/dL (ref 8.9–10.3)
Chloride: 115 mmol/L — ABNORMAL HIGH (ref 101–111)
Creatinine, Ser: 1.26 mg/dL — ABNORMAL HIGH (ref 0.44–1.00)
GFR calc Af Amer: 45 mL/min — ABNORMAL LOW (ref >60)
GFR calc non Af Amer: 39 mL/min — ABNORMAL LOW (ref >60)
GLUCOSE: 110 mg/dL — AB (ref 65–99)
POTASSIUM: 4.7 mmol/L (ref 3.5–5.1)
SODIUM: 148 mmol/L — AB (ref 135–145)
Total Bilirubin: 1.2 mg/dL (ref 0.3–1.2)
Total Protein: 6.6 g/dL (ref 6.5–8.1)

## 2015-11-22 LAB — MAGNESIUM: Magnesium: 2.2 mg/dL (ref 1.7–2.4)

## 2015-11-22 NOTE — Progress Notes (Signed)
RN assessed pt's IV site and saw that IV was out. RN assessed veins to attempt New IV site. RN saw small veins and was uncertain if will have success when attempting. RN asked another RN to come and attempt. Tobin Chad 11/22/2015 09:35AM

## 2015-11-22 NOTE — Evaluation (Signed)
Clinical/Bedside Swallow Evaluation Patient Details  Name: Christina Garcia MRN: [id number] Date of Birth: [birth year]  Today's Date: 11/22/2015 Time: SLP Start Time (ACUTE ONLY): 1044 SLP Stop Time (ACUTE ONLY): 1052 SLP Time Calculation (min) (ACUTE ONLY): 8 min  Past Medical History:  Past Medical History  Diagnosis Date  . Osteoporosis   . Dry mouth     husband states she has issue with saliva glands and has to take medicaion fot hsi  . Forgetfulness   . Hypothyroidism   . Diabetes insipidus [facility])    Past Surgical History:  Past Surgical History  Procedure Laterality Date  . None     HPI:  dementia and diabetes, admitted with hypernatremia   Assessment / Plan / Recommendation Clinical Impression  Pt performed [facility] with all consistencies assessed. No s/s aspiration noted. Complete oral clearance. No further SLP services warranted at this time. Continue with regular diet.    Aspiration Risk  No limitations    Diet Recommendation Regular;Thin liquid   Liquid Administration via: Cup;Straw Medication Administration: Whole meds with liquid Supervision: Patient able to self feed Compensations: Minimize environmental distractions;Small sips/bites Postural Changes: Seated upright at 90 degrees    Other  Recommendations Oral Care Recommendations: Oral care BID   Follow up Recommendations  None    Frequency and Duration            Prognosis        Swallow Study   General HPI: dementia and diabetes, admitted with hypernatremia Type of Study: Bedside Swallow Evaluation Previous Swallow Assessment: none Diet Prior to this Study: Regular;Thin liquids Temperature Spikes Noted: No Respiratory Status: Room air History of Recent Intubation: No Behavior/Cognition: Alert;Cooperative;Pleasant mood;Confused;Impulsive Oral Cavity Assessment: Within Functional Limits Oral Care Completed by SLP: No Oral Cavity - Dentition: Dentures, top;Dentures, bottom Vision: Functional for  self-feeding Self-Feeding Abilities: Able to feed self Patient Positioning: Upright in bed Baseline Vocal Quality: Normal Volitional Swallow: Able to elicit    Oral/Motor/Sensory Function Overall Oral Motor/Sensory Function: Within functional limits   Ice Chips Ice chips: Not tested   Thin Liquid Thin Liquid: Within functional limits Presentation: Spoon    Nectar Thick     Honey Thick     Puree Puree: Within functional limits Presentation: Self Fed;Spoon   Solid   GO   Solid: Within functional limits Presentation: Self Fed        Christina Garcia 11/22/2015,11:57 AM

## 2015-11-22 NOTE — Progress Notes (Addendum)
Patient ID: Christina Garcia, female   DOB: 11-02-1932, 82 y.o.   MRN: [id number]  [facility] PROGRESS NOTE  KAZIAH KRIZEK [id number] DOB: [birth year] DOA: 11/20/2015 PCP: Garlan Fillers, MD   Brief narrative:    82 y.o. female with a past medical history significant for diabetes insipidus on desmopressin and dementia who presented with confusion and hypernatremia.  In the ED, the patient was found to have sodium 174, creatinine 2.41, K4.3. An ECG showed sinus tachycardia with diffuse ST depressions. CXR clear. She was given a 1L NS bolus and TRH were called for admission.  At baseline she lives in ALF, independent with ADLs, dementia such that she knows some family members but not others, and repeats herself in conversations, does not use a cane or walker.  Assessment/Plan:    Acute metabolic encephalopathy / HyperNa / UTI - secondary to hypernatremia, pre renal etiology  - IVF have been provided and Na is now down to 147 - continue to encourage oral intake - continue Aztreonam day #2 and follow up on urine culture  - BMP in AM  Acute kidney injury - pre renal in etiology - responded to IVF, BMP in AM  Hyperkalemia - resolved   Diabetes insipidus - on ddavp  - monitor electrolytes   Hypothyroidism - Restarted levothyroxine - TSH wnl  Dementia with behavioral disturbance - restart depakote, Namenda, and donepezil - needs PT eval as has been bed bound since admission   Leg rash - restarted home dose prednisone  DVT prophylaxis - Lovenox SQ  Code Status: DNR Family Communication:  plan of care discussed with the patient Disposition Plan: ALF vs SNF by 4/1, or when sodium improved, needs PT eval as pt has been bed bound since admission   IV access:  Peripheral IV  Procedures and diagnostic studies:    US Renal  11/21/2015  CLINICAL DATA:  Increasing creatinine EXAM: RENAL / URINARY TRACT ULTRASOUND COMPLETE COMPARISON:  None. FINDINGS: Right Kidney:  Length: 11 cm. Echogenicity within normal limits. 2.3 x 1.7 x 2 cm anechoic right inferior pole renal mass with a thin septation most consistent with a cyst. No hydronephrosis visualized. Left Kidney: Length: 10.6 cm. Echogenicity within normal limits. No mass or hydronephrosis visualized. Bladder: Appears normal for degree of bladder distention. IMPRESSION: 1. No obstructive uropathy. Electronically Signed   By: Elige Ko   On: 11/21/2015 14:26   Dg Chest Port 1 View  11/20/2015  CLINICAL DATA:  Altered level of consciousness, hypernatremia, tachycardia, confusion, history dementia EXAM: PORTABLE CHEST 1 VIEW COMPARISON:  Portable exam 2050 hours compared to 02/21/2015 FINDINGS: Normal heart size, mediastinal contours, and pulmonary vascularity. Minimal atelectasis at RIGHT base. Lungs otherwise clear. No pleural effusion or pneumothorax. Bones demineralized. IMPRESSION: Minimal RIGHT basilar atelectasis. Electronically Signed   By: Ulyses Southward M.D.   On: 11/20/2015 21:09    Medical Consultants:  None  Other Consultants:  PT  IAnti-Infectives:   Aztreonam 3/28 -->  Debbora Presto, MD  [facility] Pager [phone]  If 7PM-7AM, please contact night-coverage [email or web address] Password [facility] 11/22/2015, 6:52 PM   LOS: 2 days   HPI/Subjective: No events overnight.   Objective: Filed Vitals:   11/21/15 1324 11/21/15 2212 11/22/15 0535 11/22/15 1331  BP: 108/78 110/50 105/88 81/56  Pulse: 102 68 66 65  Temp:   98.1 F (36.7 C) 98.1 F (36.7 C)  TempSrc:   Oral Oral  Resp: 20 20 20 17   Weight:      SpO2:  91%  98% 100%    Intake/Output Summary (Last 24 hours) at 11/22/15 1852 Last data filed at 11/22/15 1124  Gross per 24 hour  Intake 751.25 ml  Output    350 ml  Net 401.25 ml    Exam:   General:  Pt is alert, confused, NAD  Cardiovascular: Regular rate and rhythm, no rubs, no gallops  Respiratory: Clear to auscultation bilaterally, no wheezing, no crackles, no  rhonchi  Abdomen: Soft, non tender, non distended, bowel sounds present, no guarding  Extremities: No edema, pulses DP and PT palpable bilaterally   Data Reviewed: Basic Metabolic Panel:  Recent Labs Lab 11/21/15 1852 11/21/15 2157 11/22/15 0209 11/22/15 0605 11/22/15 0959  NA 149* 147* 148* 148* 147*  K 3.1* 3.8 5.3* 4.7 4.8  CL 113* 112* 116* 115* 114*  CO2 30 26 25 25 25   GLUCOSE 151* 130* 194* 110* 94  BUN 27* 26* 29* 28* 27*  CREATININE 1.62* 1.44* 1.33* 1.26* 1.12*  CALCIUM 8.4* 8.3* 8.4* 8.9 8.7*  MG  --   --   --  2.2  --    Liver Function Tests:  Recent Labs Lab 11/20/15 2050 11/22/15 0605  AST 45* 51*  ALT 28 33  ALKPHOS 78 68  BILITOT 0.9 1.2  PROT 8.0 6.6  ALBUMIN 4.1 3.2*   CBC:  Recent Labs Lab 11/20/15 2050 11/21/15 0158 11/22/15 0605  WBC 14.5* 12.1* 9.2  HGB 16.1* 15.5* 12.2  HCT 52.8* 53.4* 39.8  MCV 106.5* 107.4* 102.3*  PLT 290 251 192   CBG:  Recent Labs Lab 11/21/15 0037  GLUCAP 139*    Recent Results (from the past 240 hour(s))  MRSA PCR Screening     Status: None   Collection Time: 11/21/15  1:35 AM  Result Value Ref Range Status   MRSA by PCR NEGATIVE NEGATIVE Final    Comment:        The GeneXpert MRSA Assay (FDA approved for NASAL specimens only), is one component of a comprehensive MRSA colonization surveillance program. It is not intended to diagnose MRSA infection nor to guide or monitor treatment for MRSA infections.   Urine culture     Status: None (Preliminary result)   Collection Time: 11/21/15  4:19 PM  Result Value Ref Range Status   Specimen Description URINE, CATHETERIZED  Final   Special Requests NONE  Final   Culture TOO YOUNG TO READ  Final   Report Status PENDING  Incomplete     Scheduled Meds: . aspirin EC  81 mg Oral Daily  . aztreonam  500 mg Intravenous 3 times per day  . desmopressin  2 mcg Intravenous BID  . divalproex  125 mg Oral QPM  . donepezil  5 mg Oral QHS  . enoxaparin  (LOVENOX) injection  30 mg Subcutaneous Q24H  . levothyroxine  75 mcg Oral QAC breakfast  . memantine  10 mg Oral Daily  . pravastatin  20 mg Oral q1800  . predniSONE  20 mg Oral Daily  . sodium chloride flush  3 mL Intravenous Q12H  . traZODone  50 mg Oral QHS   Continuous Infusions: . dextrose 5% lactated ringers with KCl 20 mEq/L 75 mL/hr at 11/22/15 1324

## 2015-11-22 NOTE — Consult Note (Signed)
   [facility] CM Inpatient Consult   11/22/2015  Christina Garcia [birth year] [id number] Patient screened for potential [facility] Care Management services. Patient is eligible for [facility] Care Management services through her [employer] plan. Patient admitted with Diabetes per inpatient RNCM. Patient is from [facility] facility per social worker progress notes.  Came by and left a brochure and contact information at the bedside. [facility] Care Management services not appropriate at this time.   If patient's post hospital needs change please place a [facility] Care Management consult. For questions please contact:   Charlesetta Shanks, RN BSN CCM [facility]  [phone] business mobile phone Toll free office [phone]

## 2015-11-23 DIAGNOSIS — R748 Abnormal levels of other serum enzymes: Secondary | ICD-10-CM | POA: Diagnosis not present

## 2015-11-23 DIAGNOSIS — R7989 Other specified abnormal findings of blood chemistry: Secondary | ICD-10-CM | POA: Insufficient documentation

## 2015-11-23 DIAGNOSIS — N179 Acute kidney failure, unspecified: Secondary | ICD-10-CM | POA: Diagnosis not present

## 2015-11-23 DIAGNOSIS — F0391 Unspecified dementia with behavioral disturbance: Secondary | ICD-10-CM | POA: Diagnosis not present

## 2015-11-23 DIAGNOSIS — E870 Hyperosmolality and hypernatremia: Secondary | ICD-10-CM | POA: Diagnosis not present

## 2015-11-23 LAB — BASIC METABOLIC PANEL
Anion gap: 6 (ref 5–15)
BUN: 20 mg/dL (ref 6–20)
CALCIUM: 8.8 mg/dL — AB (ref 8.9–10.3)
CO2: 25 mmol/L (ref 22–32)
Chloride: 107 mmol/L (ref 101–111)
Creatinine, Ser: 0.78 mg/dL (ref 0.44–1.00)
GFR calc Af Amer: >60 mL/min (ref >60)
GLUCOSE: 92 mg/dL (ref 65–99)
Potassium: 4 mmol/L (ref 3.5–5.1)
Sodium: 138 mmol/L (ref 135–145)

## 2015-11-23 LAB — CBC
HEMATOCRIT: 33.7 % — AB (ref 36.0–46.0)
Hemoglobin: 10.7 g/dL — ABNORMAL LOW (ref 12.0–15.0)
MCH: 31 pg (ref 26.0–34.0)
MCHC: 31.8 g/dL (ref 30.0–36.0)
MCV: 97.7 fL (ref 78.0–100.0)
PLATELETS: 164 10*3/uL (ref 150–400)
RBC: 3.45 MIL/uL — ABNORMAL LOW (ref 3.87–5.11)
RDW: 13.5 % (ref 11.5–15.5)
WBC: 6 10*3/uL (ref 4.0–10.5)

## 2015-11-23 LAB — HEMOGLOBIN A1C
Hgb A1c MFr Bld: 5.1 % (ref 4.8–5.6)
Mean Plasma Glucose: 100 mg/dL

## 2015-11-23 MED ORDER — AZTREONAM 1 G IJ SOLR
500.0000 mg | Freq: Three times a day (TID) | INTRAMUSCULAR | Status: AC
  Administered 2015-11-23 (×2): 500 mg via INTRAMUSCULAR
  Filled 2015-11-23 (×2): qty 1

## 2015-11-23 MED ORDER — DESMOPRESSIN ACETATE 0.2 MG PO TABS
0.2000 mg | ORAL_TABLET | Freq: Two times a day (BID) | ORAL | Status: DC
  Administered 2015-11-23: 0.2 mg via ORAL
  Filled 2015-11-23 (×2): qty 1

## 2015-11-23 MED ORDER — DIPHENHYDRAMINE HCL 25 MG PO CAPS
25.0000 mg | ORAL_CAPSULE | ORAL | Status: DC | PRN
  Filled 2015-11-23: qty 1

## 2015-11-23 NOTE — Progress Notes (Signed)
Report called to [employer] Med tech [employer]. No questions or concerns voiced when asked. Pt's Niece will be here to pick her up to take her to [facility] at [phone]. Tobin Chad 11/23/2015 3:35 PM

## 2015-11-23 NOTE — Discharge Instructions (Signed)
Hypernatremia Hypernatremia is when the blood has too much salt (sodium) in it and not enough water. Water and salt must be balanced in the blood. This condition is serious and often an emergency. HOME CARE   Drink enough fluid to keep your pee (urine) clear or pale yellow.  Take over-the-counter and prescription medicines only as told by your doctor.  Avoid salty processed foods, including canned, jarred, frozen, or boxed foods. Some examples include pickles, frozen dinners, canned soups, potato and corn chips, and olives.  Always drink fluids after exercise.  Always drink fluids after throwing up (vomiting) or having watery poop (diarrhea).  Keep all follow-up visits as told by your doctor. This is important. GET HELP IF:   You have watery poop.  You throw up.  You have a fever or chills.  You cannot follow advice from your doctor about drinking fluids. GET HELP RIGHT AWAY IF:  You feel light-headed or weak.  You have a fast heartbeat.  You get confused.  You get restless.  You get short-tempered.  You have a fit of movements that you cannot control (seizure).  You faint.  You have signs of losing too much body fluid (dehydration), such as:   Dark pee, or very little or no pee.   Cracked lips.   No tears.   Dry mouth.   Sunken eyes.   Sleepiness.   This information is not intended to replace advice given to you by your health care provider. Make sure you discuss any questions you have with your health care provider.   Document Released: 08/01/2011 Document Revised: 05/03/2015 Document Reviewed: 12/28/2014 Elsevier Interactive Patient Education [employer]

## 2015-11-23 NOTE — Care Management Important Message (Signed)
Important Message  Patient Details  Name: [name] MRN: [id number] Date of Birth: 1933-04-20   Medicare Important Message Given:  Yes    Kyla Balzarine 11/23/2015, 10:43 AM

## 2015-11-23 NOTE — Progress Notes (Signed)
Patient's niece will come to pick patient up at 4pm to take her to [facility].  Osborne Casco Athene Schuhmacher LCSWA [phone]

## 2015-11-23 NOTE — Discharge Summary (Addendum)
Physician Discharge Summary  Christina Garcia EXB:[id number] DOB: August 14, 1933 DOA: 11/20/2015  PCP: Garlan Fillers, MD  Admit date: 11/20/2015 Discharge date: 11/23/2015  Recommendations for Outpatient Follow-up:  1. Pt will need to follow up with PCP in 2-3 weeks post discharge 2. Please obtain BMP to evaluate electrolytes and kidney function, Na 3. Please also check CBC to evaluate Hg and Hct levels  Discharge Diagnoses:  Principal Problem:   Hypernatremia Active Problems:   AKI (acute kidney injury) (HCC)   Diabetes insipidus (HCC)   Dementia   Hypothyroidism    Discharge Condition: Stable  Diet recommendation: Heart healthy diet discussed in details     Brief narrative:    82 y.o. female with a past medical history significant for diabetes insipidus on desmopressin and dementia who presented with confusion and hypernatremia.  In the ED, the patient was found to have sodium 174, creatinine 2.41, K4.3. An ECG showed sinus tachycardia with diffuse ST depressions. CXR clear. She was given a 1L NS bolus and TRH were called for admission.  At baseline she lives in ALF, independent with ADLs, dementia such that she knows some family members but not others, and repeats herself in conversations, does not use a cane or walker.  Assessment/Plan:    Acute metabolic encephalopathy / HyperNa / UTI - secondary to hypernatremia, pre renal etiology  - IVF have been provided and Na is now WNL - continue to encourage oral intake - completed three days of Aztreonam, stop ABX as urine culture with no growth   Acute kidney injury - pre renal in etiology - responded to IVF, resolved   Hyperkalemia - resolved   Diabetes insipidus - on ddavp  - monitor electrolytes in an outpatient setting   Hypothyroidism - Restarted levothyroxine - TSH wnl  Dementia with behavioral disturbance - restarted depakote, Namenda, and donepezil   Leg rash - restarted home dose  prednisone  Code Status: DNR Family Communication: plan of care discussed with the patient Disposition Plan: ALF  IV access:  Peripheral IV  Procedures and diagnostic studies:    Imaging Results    US Renal  11/21/2015 CLINICAL DATA: Increasing creatinine EXAM: RENAL / URINARY TRACT ULTRASOUND COMPLETE COMPARISON: None. FINDINGS: Right Kidney: Length: 11 cm. Echogenicity within normal limits. 2.3 x 1.7 x 2 cm anechoic right inferior pole renal mass with a thin septation most consistent with a cyst. No hydronephrosis visualized. Left Kidney: Length: 10.6 cm. Echogenicity within normal limits. No mass or hydronephrosis visualized. Bladder: Appears normal for degree of bladder distention. IMPRESSION: 1. No obstructive uropathy. Electronically Signed By: Elige Ko On: 11/21/2015 14:26   Dg Chest Port 1 View  11/20/2015 CLINICAL DATA: Altered level of consciousness, hypernatremia, tachycardia, confusion, history dementia EXAM: PORTABLE CHEST 1 VIEW COMPARISON: Portable exam 2050 hours compared to 02/21/2015 FINDINGS: Normal heart size, mediastinal contours, and pulmonary vascularity. Minimal atelectasis at RIGHT base. Lungs otherwise clear. No pleural effusion or pneumothorax. Bones demineralized. IMPRESSION: Minimal RIGHT basilar atelectasis. Electronically Signed By: Ulyses Southward M.D. On: 11/20/2015 21:09     Medical Consultants:  None  Other Consultants:  PT  IAnti-Infectives:   Aztreonam 3/28 --> completed three days course for uncomplicated UTI      Discharge Exam: Filed Vitals:   11/22/15 2117 11/23/15 0456  BP: 127/55 125/63  Pulse: 57 59  Temp: 98.7 F (37.1 C) 98.1 F (36.7 C)  Resp: 18 16   Filed Vitals:   11/22/15 0535 11/22/15 1331 11/22/15 2117 11/23/15 0456  BP:  105/88 81/56 127/55 125/63  Pulse: 66 65 57 59  Temp: 98.1 F (36.7 C) 98.1 F (36.7 C) 98.7 F (37.1 C) 98.1 F (36.7 C)  TempSrc: Oral Oral Oral Oral  Resp: 20 17 18  16   Weight:      SpO2: 98% 100% 98% 99%    General: Pt is alert, confused, not in acute distress Cardiovascular: Regular rate and rhythm, no rubs, no gallops Respiratory: Clear to auscultation bilaterally, no wheezing, no crackles, no rhonchi Abdominal: Soft, non tender, non distended, bowel sounds +, no guarding  Discharge Instructions  Discharge Instructions    Diet - low sodium heart healthy    Complete by:  As directed      Increase activity slowly    Complete by:  As directed             Medication List    STOP taking these medications        naproxen sodium 220 MG tablet  Commonly known as:  ANAPROX      TAKE these medications        acetaminophen 500 MG tablet  Commonly known as:  TYLENOL  Take 1,000 mg by mouth 2 (two) times daily as needed for mild pain.     aspirin 81 MG tablet  Take 81 mg by mouth daily.     desmopressin 0.2 MG tablet  Commonly known as:  DDAVP  Take 0.2 mg by mouth 2 (two) times daily.     divalproex 125 MG DR tablet  Commonly known as:  DEPAKOTE  Take 125 mg by mouth every evening.     donepezil 5 MG tablet  Commonly known as:  ARICEPT  Take 5 mg by mouth at bedtime.     ergocalciferol 50000 units capsule  Commonly known as:  VITAMIN D2  Take 50,000 Units by mouth once a week.     ferrous sulfate 325 (65 FE) MG tablet  Take 325 mg by mouth daily with breakfast.     levothyroxine 75 MCG tablet  Commonly known as:  SYNTHROID, LEVOTHROID  Take 75 mcg by mouth daily before breakfast.     LINZESS 145 MCG Caps capsule  Generic drug:  Linaclotide  Take 145 mcg by mouth daily.     memantine 10 MG tablet  Commonly known as:  NAMENDA  Take 10 mg by mouth daily.     MULTI VITAMIN PO  Take 1 tablet by mouth daily.     pravastatin 20 MG tablet  Commonly known as:  PRAVACHOL  Take 20 mg by mouth daily.     predniSONE 20 MG tablet  Commonly known as:  DELTASONE  Take 20 mg by mouth daily.     PROBIOTIC PO  Take 1 capsule  by mouth daily.     traZODone 50 MG tablet  Commonly known as:  DESYREL  Take 50 mg by mouth at bedtime.           Follow-up Information    Follow up with , MD.   Specialty:  Internal Medicine   Contact information:   [address] [place] [phone]        The results of significant diagnostics from this hospitalization (including imaging, microbiology, ancillary and laboratory) are listed below for reference.     Microbiology: Recent Results (from the past 240 hour(s))  MRSA PCR Screening     Status: None   Collection Time: 11/21/15  1:35 AM  Result Value Ref  Range Status   MRSA by PCR NEGATIVE NEGATIVE Final    Comment:        The GeneXpert MRSA Assay (FDA approved for NASAL specimens only), is one component of a comprehensive MRSA colonization surveillance program. It is not intended to diagnose MRSA infection nor to guide or monitor treatment for MRSA infections.   Urine culture     Status: None (Preliminary result)   Collection Time: 11/21/15  4:19 PM  Result Value Ref Range Status   Specimen Description URINE, CATHETERIZED  Final   Special Requests NONE  Final   Culture TOO YOUNG TO READ  Final   Report Status PENDING  Incomplete     Labs: Basic Metabolic Panel:  Recent Labs Lab 11/21/15 2157 11/22/15 0209 11/22/15 0605 11/22/15 0959 11/23/15 0537  NA 147* 148* 148* 147* 138  K 3.8 5.3* 4.7 4.8 4.0  CL 112* 116* 115* 114* 107  CO2 26 25 25 25 25   GLUCOSE 130* 194* 110* 94 92  BUN 26* 29* 28* 27* 20  CREATININE 1.44* 1.33* 1.26* 1.12* 0.78  CALCIUM 8.3* 8.4* 8.9 8.7* 8.8*  MG  --   --  2.2  --   --    Liver Function Tests:  Recent Labs Lab 11/20/15 2050 11/22/15 0605  AST 45* 51*  ALT 28 33  ALKPHOS 78 68  BILITOT 0.9 1.2  PROT 8.0 6.6  ALBUMIN 4.1 3.2*   CBC:  Recent Labs Lab 11/20/15 2050 11/21/15 0158 11/22/15 0605 11/23/15 0537  WBC 14.5* 12.1* 9.2 6.0  HGB 16.1* 15.5* 12.2 10.7*   HCT 52.8* 53.4* 39.8 33.7*  MCV 106.5* 107.4* 102.3* 97.7  PLT 290 251 192 164    BNP (last 3 results)  Recent Labs  02/21/15 1842  BNP 43.6   CBG:  Recent Labs Lab 11/21/15 0037  GLUCAP 139*   SIGNED: Time coordinating discharge: 30 minutes  MAGICK-Bev Drennen, MD  [facility] 11/23/2015, 10:30 AM Pager [phone]  If 7PM-7AM, please contact night-coverage [email or web address] Password [email or web address]

## 2015-11-23 NOTE — Progress Notes (Signed)
RN escorted pt to main entrance via wheelchair, where pt got into her niece cars. Tobin Chad 11/23/2015 4:27 PM

## 2015-11-23 NOTE — Progress Notes (Addendum)
Pt lost her IV access after multiple IV insertions. MD on call, Schorr, NP was notified. She re ordered the IV Aztreonam Abx to be IM. Pharmacy was called for instructions and thee pharmacist advised to dilute in 4ml NS and give 1.39ml (500g). He said it could be given in her deltoid. Pt is very small, with minimal shoulder muscle. Injection was administered to the thigh muscle where there was more muscle per assessment. Pt is still without IV access, and MD aware. Desmopressin not given, due to lost IV access, MD aware and advised to hold the BP med since her BP was normal during this time.

## 2015-11-23 NOTE — Evaluation (Signed)
Physical Therapy Evaluation Patient Details Name: Christina Garcia MRN: [id number] DOB: 06-Sep-1932 Today's Date: 11/23/2015   History of Present Illness  Christina Garcia is a 82 y.o. female with a past medical history significant for dementia, diabetes insipidus on desmopressin and dementia who presents with confusion and hypernatremia.  Clinical Impression  Patient presents likely at functional baseline.  Seems to mobilize well no devices and was able to toilet with supervision for safety due to bending to pick up paper off the floor, but no LOB noted.  Likely safe to return to ALF with no follow up PT needs.  However, may still have cognitive deficits as her baseline is unknown.    Follow Up Recommendations No PT follow up    Equipment Recommendations  None recommended by PT    Recommendations for Other Services       Precautions / Restrictions Precautions Precautions: None      Mobility  Bed Mobility Overal bed mobility: Needs Assistance Bed Mobility: Supine to Sit;Sit to Supine     Supine to sit: Min guard Sit to supine: Supervision   General bed mobility comments: assist only for guidance to come up to sit due to poor follow through of commands with dementia, and only focused attention  Transfers Overall transfer level: Modified independent               General transfer comment: up from bed, 3:1 over commode  Ambulation/Gait Ambulation/Gait assistance: Supervision Ambulation Distance (Feet): 150 Feet Assistive device: None Gait Pattern/deviations: WFL(Within Functional Limits)     General Gait Details: slightly flexed and hugs wall, but no LOB, no weakness noted in LE's  Stairs            Wheelchair Mobility    Modified Rankin (Stroke Patients Only)       Balance Overall balance assessment: Independent           Standing balance-Leahy Scale: Good Standing balance comment: standing to wash hands and then clean the sink; bending over to  floor to retrive items no A and no LOB                             Pertinent Vitals/Pain Pain Assessment: No/denies pain    Home Living Family/patient expects to be discharged to:: Assisted living               Home Equipment: None      Prior Function Level of Independence: Needs assistance      ADL's / Homemaking Assistance Needed: for meds, IADL's due to dementia, but was I in ADL's        Hand Dominance        Extremity/Trunk Assessment   Upper Extremity Assessment: Overall WFL for tasks assessed           Lower Extremity Assessment: Overall WFL for tasks assessed      Cervical / Trunk Assessment: Kyphotic  Communication      Cognition Arousal/Alertness: Awake/alert Behavior During Therapy: WFL for tasks assessed/performed Overall Cognitive Status: No family/caregiver present to determine baseline cognitive functioning                      General Comments      Exercises        Assessment/Plan    PT Assessment Patent does not need any further PT services  PT Diagnosis Generalized weakness   PT Problem List  PT Treatment Interventions     PT Goals (Current goals can be found in the Care Plan section) Acute Rehab PT Goals PT Goal Formulation: All assessment and education complete, DC therapy    Frequency     Barriers to discharge        Co-evaluation               End of Session Equipment Utilized During Treatment: Gait belt Activity Tolerance: Patient tolerated treatment well Patient left: in bed;with bed alarm set           Time: 1300-1315 PT Time Calculation (min) (ACUTE ONLY): 15 min   Charges:   PT Evaluation $PT Eval Moderate Complexity: 1 Procedure     PT G CodesElray Mcgregor 12/20/2015, 1:26 PM  Sheran Lawless, PT [phone] 12-20-15

## 2015-11-23 NOTE — NC FL2 (Signed)
[place] MEDICAID FL2 LEVEL OF CARE SCREENING TOOL     IDENTIFICATION  Patient Name: Christina Garcia Birthdate: 07/16/1933 Sex: female Admission Date (Current Location): 11/20/2015  [facility] and [place] Number:  Producer, television/film/video and Address:  [facility] [place], [address] [place], [place] [address]      Provider Number: [id number]  Attending Physician Name and Address:  Dorothea Ogle, MD  Relative Name and Phone Number:  Virgie Dad, [phone]    Current Level of Care: Hospital Recommended Level of Care: Assisted Living Facility Prior Approval Number:    Date Approved/Denied:   PASRR Number:    Discharge Plan: Other (Comment) (ALF)    Current Diagnoses: Patient Active Problem List   Diagnosis Date Noted  . Creatinine elevation   . Hypernatremia 11/20/2015  . AKI (acute kidney injury) (HCC) 11/20/2015  . Diabetes insipidus (HCC) 11/20/2015  . Dementia 11/20/2015  . Hypothyroidism 11/20/2015    Orientation RESPIRATION BLADDER Height & Weight     Self  Normal Incontinent Weight: 101 lb 8 oz (46.04 kg) Height:     BEHAVIORAL SYMPTOMS/MOOD NEUROLOGICAL BOWEL NUTRITION STATUS      Continent Diet (Regular)  AMBULATORY STATUS COMMUNICATION OF NEEDS Skin   Limited Assist Verbally Normal                       Personal Care Assistance Level of Assistance  Bathing, Feeding, Dressing Bathing Assistance: Maximum assistance Feeding assistance: Limited assistance Dressing Assistance: Limited assistance     Functional Limitations Info             SPECIAL CARE FACTORS FREQUENCY                       Contractures      Additional Factors Info  Code Status, Allergies Code Status Info: DNR Allergies Info: Keflex, Fosamax           Current Medications (11/23/2015):   Discharge Medications: STOP taking these medications       naproxen sodium 220 MG tablet  Commonly known as: ANAPROX      TAKE these  medications       acetaminophen 500 MG tablet  Commonly known as: TYLENOL  Take 1,000 mg by mouth 2 (two) times daily as needed for mild pain.     aspirin 81 MG tablet  Take 81 mg by mouth daily.     desmopressin 0.2 MG tablet  Commonly known as: DDAVP  Take 0.2 mg by mouth 2 (two) times daily.     divalproex 125 MG DR tablet  Commonly known as: DEPAKOTE  Take 125 mg by mouth every evening.     donepezil 5 MG tablet  Commonly known as: ARICEPT  Take 5 mg by mouth at bedtime.     ergocalciferol 50000 units capsule  Commonly known as: VITAMIN D2  Take 50,000 Units by mouth once a week.     ferrous sulfate 325 (65 FE) MG tablet  Take 325 mg by mouth daily with breakfast.     levothyroxine 75 MCG tablet  Commonly known as: SYNTHROID, LEVOTHROID  Take 75 mcg by mouth daily before breakfast.     LINZESS 145 MCG Caps capsule  Generic drug: Linaclotide  Take 145 mcg by mouth daily.     memantine 10 MG tablet  Commonly known as: NAMENDA  Take 10 mg by mouth daily.     MULTI VITAMIN PO  Take 1 tablet by mouth daily.     pravastatin 20 MG tablet  Commonly known as: PRAVACHOL  Take 20 mg by mouth daily.     predniSONE 20 MG tablet  Commonly known as: DELTASONE  Take 20 mg by mouth daily.     PROBIOTIC PO  Take 1 capsule by mouth daily.     traZODone 50 MG tablet  Commonly known as: DESYREL  Take 50 mg by mouth at bedtime.           Relevant Imaging Results:  Relevant Lab Results:   Additional Information SSN: [id number] [place], [place]

## 2015-11-23 NOTE — Progress Notes (Signed)
CSW sent FL2 and DC summary to facility. [facility] nurse Joy, reports patient can come back today.  CSW signing off.  Osborne Casco Daneli Butkiewicz LCSWA [phone]

## 2015-11-25 LAB — URINE CULTURE

## 2015-11-30 DIAGNOSIS — G309 Alzheimer's disease, unspecified: Secondary | ICD-10-CM | POA: Diagnosis not present

## 2015-12-04 ENCOUNTER — Emergency Department (HOSPITAL_COMMUNITY): Payer: PPO

## 2015-12-04 ENCOUNTER — Inpatient Hospital Stay (HOSPITAL_COMMUNITY)
Admission: EM | Admit: 2015-12-04 | Discharge: 2015-12-08 | DRG: 194 | Disposition: A | Discharge status: Nursing Home (Includes ICF) | Payer: PPO | Attending: Family Medicine | Admitting: Family Medicine

## 2015-12-04 ENCOUNTER — Observation Stay (HOSPITAL_COMMUNITY): Payer: PPO

## 2015-12-04 ENCOUNTER — Encounter (HOSPITAL_COMMUNITY): Payer: Self-pay | Admitting: Emergency Medicine

## 2015-12-04 DIAGNOSIS — Z7982 Long term (current) use of aspirin: Secondary | ICD-10-CM | POA: Diagnosis not present

## 2015-12-04 DIAGNOSIS — Z820 Family history of epilepsy and other diseases of the nervous system: Secondary | ICD-10-CM | POA: Diagnosis not present

## 2015-12-04 DIAGNOSIS — R1030 Lower abdominal pain, unspecified: Secondary | ICD-10-CM

## 2015-12-04 DIAGNOSIS — E039 Hypothyroidism, unspecified: Secondary | ICD-10-CM | POA: Diagnosis not present

## 2015-12-04 DIAGNOSIS — B9620 Unspecified Escherichia coli [E. coli] as the cause of diseases classified elsewhere: Secondary | ICD-10-CM | POA: Diagnosis present

## 2015-12-04 DIAGNOSIS — Z66 Do not resuscitate: Secondary | ICD-10-CM | POA: Diagnosis not present

## 2015-12-04 DIAGNOSIS — Y95 Nosocomial condition: Secondary | ICD-10-CM | POA: Diagnosis not present

## 2015-12-04 DIAGNOSIS — M810 Age-related osteoporosis without current pathological fracture: Secondary | ICD-10-CM | POA: Diagnosis not present

## 2015-12-04 DIAGNOSIS — E785 Hyperlipidemia, unspecified: Secondary | ICD-10-CM | POA: Diagnosis present

## 2015-12-04 DIAGNOSIS — R404 Transient alteration of awareness: Secondary | ICD-10-CM | POA: Diagnosis not present

## 2015-12-04 DIAGNOSIS — E232 Diabetes insipidus: Secondary | ICD-10-CM | POA: Diagnosis present

## 2015-12-04 DIAGNOSIS — D638 Anemia in other chronic diseases classified elsewhere: Secondary | ICD-10-CM | POA: Diagnosis present

## 2015-12-04 DIAGNOSIS — J189 Pneumonia, unspecified organism: Secondary | ICD-10-CM | POA: Diagnosis not present

## 2015-12-04 DIAGNOSIS — R4182 Altered mental status, unspecified: Secondary | ICD-10-CM | POA: Diagnosis not present

## 2015-12-04 DIAGNOSIS — R55 Syncope and collapse: Secondary | ICD-10-CM | POA: Diagnosis not present

## 2015-12-04 DIAGNOSIS — Z888 Allergy status to other drugs, medicaments and biological substances status: Secondary | ICD-10-CM | POA: Diagnosis not present

## 2015-12-04 DIAGNOSIS — N390 Urinary tract infection, site not specified: Secondary | ICD-10-CM | POA: Diagnosis not present

## 2015-12-04 DIAGNOSIS — N3000 Acute cystitis without hematuria: Secondary | ICD-10-CM

## 2015-12-04 DIAGNOSIS — F0390 Unspecified dementia without behavioral disturbance: Secondary | ICD-10-CM | POA: Diagnosis present

## 2015-12-04 DIAGNOSIS — R112 Nausea with vomiting, unspecified: Secondary | ICD-10-CM | POA: Diagnosis not present

## 2015-12-04 DIAGNOSIS — Z881 Allergy status to other antibiotic agents status: Secondary | ICD-10-CM

## 2015-12-04 LAB — CBC
HEMATOCRIT: 33 % — AB (ref 36.0–46.0)
HEMOGLOBIN: 10.9 g/dL — AB (ref 12.0–15.0)
MCH: 31.6 pg (ref 26.0–34.0)
MCHC: 33 g/dL (ref 30.0–36.0)
MCV: 95.7 fL (ref 78.0–100.0)
Platelets: 179 10*3/uL (ref 150–400)
RBC: 3.45 MIL/uL — ABNORMAL LOW (ref 3.87–5.11)
RDW: 13.8 % (ref 11.5–15.5)
WBC: 5 10*3/uL (ref 4.0–10.5)

## 2015-12-04 LAB — BASIC METABOLIC PANEL
ANION GAP: 10 (ref 5–15)
BUN: 13 mg/dL (ref 6–20)
CO2: 27 mmol/L (ref 22–32)
Calcium: 9 mg/dL (ref 8.9–10.3)
Chloride: 98 mmol/L — ABNORMAL LOW (ref 101–111)
Creatinine, Ser: 0.7 mg/dL (ref 0.44–1.00)
GFR calc Af Amer: >60 mL/min (ref >60)
GFR calc non Af Amer: >60 mL/min (ref >60)
GLUCOSE: 135 mg/dL — AB (ref 65–99)
POTASSIUM: 3.1 mmol/L — AB (ref 3.5–5.1)
Sodium: 135 mmol/L (ref 135–145)

## 2015-12-04 LAB — CBG MONITORING, ED: Glucose-Capillary: 124 mg/dL — ABNORMAL HIGH (ref 65–99)

## 2015-12-04 LAB — URINE MICROSCOPIC-ADD ON

## 2015-12-04 LAB — URINALYSIS, ROUTINE W REFLEX MICROSCOPIC
BILIRUBIN URINE: NEGATIVE
GLUCOSE, UA: NEGATIVE mg/dL
Ketones, ur: NEGATIVE mg/dL
Nitrite: POSITIVE — AB
PH: 7 (ref 5.0–8.0)
Protein, ur: NEGATIVE mg/dL
SPECIFIC GRAVITY, URINE: 1.013 (ref 1.005–1.030)

## 2015-12-04 LAB — TSH: TSH: 5.85 u[IU]/mL — ABNORMAL HIGH (ref 0.350–4.500)

## 2015-12-04 LAB — STREP PNEUMONIAE URINARY ANTIGEN: Strep Pneumo Urinary Antigen: NEGATIVE

## 2015-12-04 LAB — PROCALCITONIN

## 2015-12-04 LAB — I-STAT CG4 LACTIC ACID, ED: Lactic Acid, Venous: 1.96 mmol/L (ref 0.5–2.0)

## 2015-12-04 MED ORDER — PIPERACILLIN-TAZOBACTAM 3.375 G IVPB 30 MIN: 3.3750 g | Freq: Once | INTRAVENOUS | Status: DC

## 2015-12-04 MED ORDER — ZINC OXIDE 11.3 % EX CREA
TOPICAL_CREAM | Freq: Two times a day (BID) | CUTANEOUS | Status: DC
  Administered 2015-12-05 – 2015-12-08 (×7): via TOPICAL
  Filled 2015-12-04 (×2): qty 56

## 2015-12-04 MED ORDER — LEVOTHYROXINE SODIUM 75 MCG PO TABS
75.0000 ug | ORAL_TABLET | Freq: Every day | ORAL | Status: DC
  Administered 2015-12-05 – 2015-12-08 (×4): 75 ug via ORAL
  Filled 2015-12-04 (×4): qty 1

## 2015-12-04 MED ORDER — AZTREONAM 2 G IJ SOLR
2.0000 g | Freq: Three times a day (TID) | INTRAMUSCULAR | Status: DC
  Filled 2015-12-04: qty 2

## 2015-12-04 MED ORDER — IPRATROPIUM BROMIDE 0.02 % IN SOLN: 0.5000 mg | RESPIRATORY_TRACT | Status: DC | PRN

## 2015-12-04 MED ORDER — DIVALPROEX SODIUM 125 MG PO DR TAB
125.0000 mg | DELAYED_RELEASE_TABLET | Freq: Every day | ORAL | Status: DC
  Administered 2015-12-05 – 2015-12-07 (×3): 125 mg via ORAL
  Filled 2015-12-04 (×4): qty 1

## 2015-12-04 MED ORDER — ACETAMINOPHEN 650 MG RE SUPP: 650.0000 mg | Freq: Four times a day (QID) | RECTAL | Status: DC | PRN

## 2015-12-04 MED ORDER — GUAIFENESIN ER 600 MG PO TB12
600.0000 mg | ORAL_TABLET | Freq: Two times a day (BID) | ORAL | Status: DC
  Administered 2015-12-04 – 2015-12-08 (×8): 600 mg via ORAL
  Filled 2015-12-04 (×8): qty 1

## 2015-12-04 MED ORDER — VANCOMYCIN HCL IN DEXTROSE 750-5 MG/150ML-% IV SOLN
750.0000 mg | INTRAVENOUS | Status: DC
  Administered 2015-12-06 (×2): 750 mg via INTRAVENOUS
  Filled 2015-12-04 (×3): qty 150

## 2015-12-04 MED ORDER — DESMOPRESSIN ACETATE 0.2 MG PO TABS
0.2000 mg | ORAL_TABLET | Freq: Two times a day (BID) | ORAL | Status: DC
  Administered 2015-12-05 – 2015-12-08 (×7): 0.2 mg via ORAL
  Filled 2015-12-04 (×7): qty 1

## 2015-12-04 MED ORDER — FERROUS SULFATE 325 (65 FE) MG PO TABS
325.0000 mg | ORAL_TABLET | Freq: Every day | ORAL | Status: DC
  Administered 2015-12-05 – 2015-12-08 (×4): 325 mg via ORAL
  Filled 2015-12-04 (×4): qty 1

## 2015-12-04 MED ORDER — DEXTROSE 5 % IV SOLN
1.0000 g | Freq: Three times a day (TID) | INTRAVENOUS | Status: DC
  Administered 2015-12-04 – 2015-12-07 (×8): 1 g via INTRAVENOUS
  Filled 2015-12-04 (×14): qty 1

## 2015-12-04 MED ORDER — TRAMADOL HCL 50 MG PO TABS: 50.0000 mg | ORAL_TABLET | Freq: Four times a day (QID) | ORAL | Status: DC | PRN

## 2015-12-04 MED ORDER — SODIUM CHLORIDE 0.9 % IV BOLUS (SEPSIS)
1000.0000 mL | Freq: Once | INTRAVENOUS | Status: AC
  Administered 2015-12-04: 1000 mL via INTRAVENOUS

## 2015-12-04 MED ORDER — ONDANSETRON HCL 4 MG PO TABS: 4.0000 mg | ORAL_TABLET | Freq: Four times a day (QID) | ORAL | Status: DC | PRN

## 2015-12-04 MED ORDER — PRAVASTATIN SODIUM 20 MG PO TABS
20.0000 mg | ORAL_TABLET | Freq: Every day | ORAL | Status: DC
  Administered 2015-12-05 – 2015-12-07 (×3): 20 mg via ORAL
  Filled 2015-12-04 (×3): qty 1

## 2015-12-04 MED ORDER — ACETAMINOPHEN 325 MG PO TABS
650.0000 mg | ORAL_TABLET | Freq: Four times a day (QID) | ORAL | Status: DC | PRN
  Administered 2015-12-05: 650 mg via ORAL
  Filled 2015-12-04: qty 2

## 2015-12-04 MED ORDER — VANCOMYCIN HCL IN DEXTROSE 1-5 GM/200ML-% IV SOLN
1000.0000 mg | Freq: Once | INTRAVENOUS | Status: AC
  Administered 2015-12-04: 1000 mg via INTRAVENOUS
  Filled 2015-12-04: qty 200

## 2015-12-04 MED ORDER — ONDANSETRON HCL 4 MG/2ML IJ SOLN
4.0000 mg | Freq: Four times a day (QID) | INTRAMUSCULAR | Status: DC | PRN
Start: 2015-12-04 — End: 2015-12-08

## 2015-12-04 MED ORDER — POTASSIUM CHLORIDE CRYS ER 20 MEQ PO TBCR
40.0000 meq | EXTENDED_RELEASE_TABLET | ORAL | Status: AC
Start: 2015-12-04 — End: 2015-12-04
  Administered 2015-12-04: 40 meq via ORAL
  Filled 2015-12-04: qty 2

## 2015-12-04 MED ORDER — SODIUM CHLORIDE 0.9% FLUSH
3.0000 mL | Freq: Two times a day (BID) | INTRAVENOUS | Status: DC
  Administered 2015-12-04 – 2015-12-05 (×3): 3 mL via INTRAVENOUS

## 2015-12-04 MED ORDER — ENOXAPARIN SODIUM 30 MG/0.3ML ~~LOC~~ SOLN
30.0000 mg | SUBCUTANEOUS | Status: DC
  Administered 2015-12-05: 30 mg via SUBCUTANEOUS
  Filled 2015-12-04: qty 0.3

## 2015-12-04 MED ORDER — DONEPEZIL HCL 10 MG PO TABS
10.0000 mg | ORAL_TABLET | Freq: Every day | ORAL | Status: DC
  Administered 2015-12-05 – 2015-12-07 (×3): 10 mg via ORAL
  Filled 2015-12-04 (×3): qty 1

## 2015-12-04 MED ORDER — ADULT MULTIVITAMIN W/MINERALS CH
1.0000 | ORAL_TABLET | Freq: Every day | ORAL | Status: DC
  Administered 2015-12-05 – 2015-12-07 (×3): 1 via ORAL
  Filled 2015-12-04 (×3): qty 1

## 2015-12-04 MED ORDER — MEMANTINE HCL 10 MG PO TABS
10.0000 mg | ORAL_TABLET | Freq: Every day | ORAL | Status: DC
  Administered 2015-12-05 – 2015-12-08 (×4): 10 mg via ORAL
  Filled 2015-12-04 (×4): qty 1

## 2015-12-04 MED ORDER — CALAMINE EX LOTN: 1.0000 "application " | TOPICAL_LOTION | Freq: Every day | CUTANEOUS | Status: DC | PRN

## 2015-12-04 MED ORDER — TRAZODONE HCL 50 MG PO TABS
50.0000 mg | ORAL_TABLET | Freq: Every day | ORAL | Status: DC
  Administered 2015-12-05 – 2015-12-07 (×3): 50 mg via ORAL
  Filled 2015-12-04 (×3): qty 1

## 2015-12-04 MED ORDER — ASPIRIN 81 MG PO CHEW
81.0000 mg | CHEWABLE_TABLET | Freq: Every day | ORAL | Status: DC
  Administered 2015-12-05 – 2015-12-07 (×3): 81 mg via ORAL
  Filled 2015-12-04 (×3): qty 1

## 2015-12-04 MED ORDER — ALBUTEROL SULFATE (2.5 MG/3ML) 0.083% IN NEBU: 2.5000 mg | INHALATION_SOLUTION | RESPIRATORY_TRACT | Status: DC | PRN

## 2015-12-04 NOTE — ED Notes (Signed)
Family at bedside. 

## 2015-12-04 NOTE — ED Notes (Signed)
Patient transported to X-ray 

## 2015-12-04 NOTE — Progress Notes (Signed)
Pharmacy Antibiotic Note  Christina Garcia is a 82 y.o. female admitted on 12/04/2015 with pneumonia.  Pharmacy has been consulted for vancomycin dosing. Pt is afebrile and WBC is WNL. SCr is also WNL. Lactic acid is 1.96. Aztreonam per MD also started.   Plan: - Vancomycin 1gm IV x 1 then 750mg  IV Q24H - Change aztreonam to 1gm IV Q8H - F/u renal fxn, C&S, clinical status and trough at SS     Temp (24hrs), Avg:97.8 F (36.6 C), Min:97.8 F (36.6 C), Max:97.8 F (36.6 C)   Recent Labs Lab 12/04/15 1901 12/04/15 2119  WBC 5.0  --   CREATININE 0.70  --   LATICACIDVEN  --  1.96    CrCl cannot be calculated (Unknown ideal weight.).    Allergies  Allergen Reactions  . Keflex [Cephalexin] Other (See Comments)    Transcribed from office notes faxed to short stay as "critical"  . Fosamax [Alendronate Sodium] Other (See Comments)    Transcribed from office notes as "critical"    Antimicrobials this admission: Vanc 4/10>> Aztreo 4/10>>  Dose adjustments this admission: N/A  Microbiology results: Pending  Thank you for allowing pharmacy to be a part of this patient's care.  Yomaira Solar, 2120 12/04/2015 9:55 PM

## 2015-12-04 NOTE — ED Provider Notes (Signed)
CSN: [id number]     Arrival date & time 12/04/15  1834 History   First MD Initiated Contact with Patient 12/04/15 1840     Chief Complaint  Patient presents with  . Loss of Consciousness     (Consider location/radiation/quality/duration/timing/severity/associated sxs/prior Treatment) Patient is a 82 y.o. female presenting with syncope. The history is provided by the EMS personnel.  Loss of Consciousness Episode history:  Multiple Most recent episode:  Today Duration: about a minute. Timing:  Intermittent Progression:  Waxing and waning Chronicity:  New Context: sitting down   Context comment:  At rest while at dinner Witnessed: yes   Relieved by: she was able to be woken up with movement by FD. Worsened by:  Nothing tried Ineffective treatments:  None tried Associated symptoms: confusion (baseline dementia repeatedy states "Idont know" to questions), malaise/fatigue, nausea and vomiting   Associated symptoms: no anxiety, no difficulty breathing and no focal weakness   Risk factors: no coronary artery disease   Risk factors comment:  Hx of DI, with recent admission for significant electrolyte abnormalities  82 y.o. female with history of mentioned, hypothyroidism,  Past Medical History  Diagnosis Date  . Osteoporosis   . Dry mouth     husband states she has issue with saliva glands and has to take medicaion fot hsi  . Forgetfulness   . Hypothyroidism   . Diabetes insipidus [facility])    Past Surgical History  Procedure Laterality Date  . None     Family History  Problem Relation Age of Onset  . Alzheimer's disease Mother   . Alzheimer's disease Brother    Social History  Substance Use Topics  . Smoking status: Never Smoker   . Smokeless tobacco: None  . Alcohol Use: No   OB History    No data available     Review of Systems  Unable to perform ROS: Dementia  Constitutional: Positive for malaise/fatigue.  Cardiovascular: Positive for syncope.  Gastrointestinal:  Positive for nausea and vomiting.  Neurological: Positive for syncope. Negative for focal weakness.  Psychiatric/Behavioral: Positive for confusion (baseline dementia repeatedy states "Idont know" to questions).      Allergies  Keflex and Fosamax  Home Medications   Prior to Admission medications   Medication Sig Start Date End Date Taking? Authorizing Provider  acetaminophen (TYLENOL) 500 MG tablet Take 1,000 mg by mouth 2 (two) times daily as needed for mild pain.   Yes Historical Provider, MD  aspirin 81 MG chewable tablet Chew 81 mg by mouth daily before supper.   Yes Historical Provider, MD  calamine lotion Apply 1 application topically daily as needed (rash).   Yes Historical Provider, MD  desmopressin (DDAVP) 0.2 MG tablet Take 0.2 mg by mouth 2 (two) times daily.   Yes Historical Provider, MD  divalproex (DEPAKOTE) 125 MG DR tablet Take 125 mg by mouth daily before supper.    Yes Historical Provider, MD  donepezil (ARICEPT) 10 MG tablet Take 10 mg by mouth daily before supper.   Yes Historical Provider, MD  ergocalciferol (VITAMIN D2) 50000 UNITS capsule Take 50,000 Units by mouth once a week. Thursdays   Yes Historical Provider, MD  ferrous sulfate 325 (65 FE) MG tablet Take 325 mg by mouth daily with breakfast.   Yes Historical Provider, MD  levothyroxine (SYNTHROID, LEVOTHROID) 75 MCG tablet Take 75 mcg by mouth daily before breakfast.   Yes Historical Provider, MD  memantine (NAMENDA) 10 MG tablet Take 10 mg by mouth daily.  Yes Historical Provider, MD  Multiple Vitamin (MULTIVITAMIN WITH MINERALS) TABS tablet Take 1 tablet by mouth daily before supper.   Yes Historical Provider, MD  pravastatin (PRAVACHOL) 20 MG tablet Take 20 mg by mouth daily before supper.    Yes Historical Provider, MD  traZODone (DESYREL) 50 MG tablet Take 50 mg by mouth daily before supper.    Yes Historical Provider, MD  Zinc Oxide (BALMEX EX) Apply 1 application topically 2 (two) times daily. Apply to  right lower leg and eczema areas twice daily   Yes Historical Provider, MD   BP 141/58 mmHg  Pulse 61  Temp(Src) 97.9 F (36.6 C) (Oral)  Resp 14  Ht 4' 11.45" (1.51 m)  Wt 46 kg  BMI 20.17 kg/m2  SpO2 97% Physical Exam  Constitutional: She appears well-developed and well-nourished. No distress.  HENT:  Head: Normocephalic and atraumatic.  Nose: Nose normal.  Mouth/Throat: Oropharynx is clear and moist.  Eyes: Conjunctivae are normal. Pupils are equal, round, and reactive to light.  Neck: Normal range of motion. Neck supple.  Cardiovascular: Normal rate, regular rhythm, normal heart sounds and intact distal pulses.   Pulmonary/Chest: Effort normal. No respiratory distress. She has decreased breath sounds in the left lower field. She has no wheezes. She has rhonchi in the left lower field. Rales: LLL. She exhibits no tenderness.  Abdominal: Soft. She exhibits no distension. There is no tenderness.  Musculoskeletal: She exhibits no edema or tenderness.  Neurological: She is alert. No cranial nerve deficit. Coordination normal.  Oriented to person, baseline  Skin: Skin is warm and dry. She is not diaphoretic.  Nursing note and vitals reviewed.   ED Course  Procedures (including critical care time) Labs Review Labs Reviewed  BASIC METABOLIC PANEL - Abnormal; Notable for the following:    Potassium 3.1 (*)    Chloride 98 (*)    Glucose, Bld 135 (*)    All other components within normal limits  CBC - Abnormal; Notable for the following:    RBC 3.45 (*)    Hemoglobin 10.9 (*)    HCT 33.0 (*)    All other components within normal limits  URINALYSIS, ROUTINE W REFLEX MICROSCOPIC (NOT AT [facility]) - Abnormal; Notable for the following:    APPearance HAZY (*)    Hgb urine dipstick TRACE (*)    Nitrite POSITIVE (*)    Leukocytes, UA TRACE (*)    All other components within normal limits  TSH - Abnormal; Notable for the following:    TSH 5.850 (*)    All other components within  normal limits  URINE MICROSCOPIC-ADD ON - Abnormal; Notable for the following:    Squamous Epithelial / LPF 0-5 (*)    Bacteria, UA MANY (*)    All other components within normal limits  CBG MONITORING, ED - Abnormal; Notable for the following:    Glucose-Capillary 124 (*)    All other components within normal limits  CULTURE, BLOOD (ROUTINE X 2)  CULTURE, BLOOD (ROUTINE X 2)  CULTURE, EXPECTORATED SPUTUM-ASSESSMENT  GRAM STAIN  URINE CULTURE  STREP PNEUMONIAE URINARY ANTIGEN  PROCALCITONIN  TROPONIN I  CBC  COMPREHENSIVE METABOLIC PANEL  IRON AND TIBC  I-STAT CG4 LACTIC ACID, ED  I-STAT CG4 LACTIC ACID, ED    Imaging Review Dg Chest 2 View  12/04/2015  CLINICAL DATA:  Basilar arouse on physical examination. Unresponsiveness and diagonal breathing. EXAM: CHEST - 2 VIEW COMPARISON:  11/20/2015 FINDINGS: progressive airspace infiltrate in the left lower  lung, likely predominantly in the lower lobe but also potentially in the lingula. Findings are consistent with pneumonia. There also is increased opacity at the right lung base, less severe. This could also represent an acute infiltrate. The heart size and mediastinal contours are normal. No pleural effusions or pneumothorax. The visualized skeletal structures are unremarkable. IMPRESSION: Left lower lung pneumonia. Also potential milder right basilar infiltrate. Electronically Signed   By: Irish Lack M.D.   On: 12/04/2015 19:45   Ct Head Wo Contrast  12/04/2015  CLINICAL DATA:  Acute onset of altered mental status. Vomiting. Initial encounter. EXAM: CT HEAD WITHOUT CONTRAST TECHNIQUE: Contiguous axial images were obtained from the base of the skull through the vertex without intravenous contrast. COMPARISON:  None. FINDINGS: There is no evidence of acute infarction, mass lesion, or intra- or extra-axial hemorrhage on CT. Prominence of ventricles and sulci reflects mild to moderate cortical volume loss. Scattered periventricular white  matter change likely reflects small vessel ischemic microangiopathy. The brainstem and fourth ventricle are within normal limits. The basal ganglia are unremarkable in appearance. The cerebral hemispheres demonstrate grossly normal gray-white differentiation. No mass effect or midline shift is seen. There is no evidence of fracture; visualized osseous structures are unremarkable in appearance. The visualized portions of the orbits are within normal limits. The paranasal sinuses and mastoid air cells are well-aerated. No significant soft tissue abnormalities are seen. IMPRESSION: 1. No acute intracranial pathology seen on CT. 2. Mild to moderate cortical volume loss and scattered small vessel ischemic microangiopathy. Electronically Signed   By: Roanna Raider M.D.   On: 12/04/2015 20:14   Dg Abd Portable 1v  12/04/2015  CLINICAL DATA:  One day history of lower abdominal pain EXAM: PORTABLE ABDOMEN - 1 VIEW COMPARISON:  CT abdomen and pelvis June 02, 2013 FINDINGS: There is moderate stool throughout the colon. There is no bowel dilatation or air-fluid level suggesting obstruction. No free air is seen on this supine examination. There are vascular calcifications in the pelvis. IMPRESSION: Bowel gas pattern unremarkable without obstruction or free air. Moderate stool throughout colon. Electronically Signed   By: Bretta Bang III M.D.   On: 12/04/2015 23:32   I have personally reviewed and evaluated these images and lab results as part of my medical decision-making.   EKG Interpretation   Date/Time:  Monday December 04 2015 18:43:42 EDT Ventricular Rate:  64 PR Interval:  182 QRS Duration: 112 QT Interval:  451 QTC Calculation: 465 R Axis:   36 Text Interpretation:  Sinus rhythm Paired ventricular premature complexes  Borderline intraventricular conduction delay TECHNICALLY DIFFICULT no wpw  prolonged qt, or brugada Confirmed by FLOYD MD, Reuel Boom ([id number]) on  12/04/2015 6:53:44 PM      MDM  82  y.o. female with a hx of dementia presents to the ED after she had a few syncopal episodes while at dinner tonight at her nursing home. No seizure activity. She was reported to have sonorous respirations during these episodes. She then quickly returned to her neurologic baseline with tactile stimulation. On arrival she is alert, somnolent but arousable. VS stable, AF. Physical exam finds significant LLL rhonchi. CXr shows LLL PNA. Urine resulted showing evidence of UTI.Labs returned showing no leukocytosis, mild anemia, normal lactate. CT head was done due to AMS and was negative for acute abnormality. She was then given HCAP abx given recent admission and was admitted to the hospitalist for further care and assessment.    Final diagnoses:  Abdominal pain, lower  Francoise Ceo, DO 12/05/15 0214  Melene Plan, DO 12/05/15 1354

## 2015-12-04 NOTE — ED Notes (Signed)
Christina Garcia [phone] - POA [name] 336 [phone] - POA

## 2015-12-04 NOTE — ED Notes (Signed)
From [facility], at dinner pt had several periods of unresponsiveness, reported to have had agonal resps with FD.  Is alert on arrival and at neuro baseline.   Vomited on arrival  172/80 55 98% 2L CBG 191

## 2015-12-04 NOTE — H&P (Signed)
[facility] History and Physical  Christina Garcia DVV:[id number] DOB: [birth year] DOA: 12/04/2015  Referring physician: ED PCP: Garlan Fillers, MD   Chief Complaint: Syncope   HPI:  Ms. Storlie is a 82 year old female with a past medical history significant for dementia, diabetes insipidus, and hypothyroidism; who presents after having syncopal episodes this evening. History is obtained by the patient's niece and nephew is the patient's unable to provide her own due to her history of dementia. Patient had reportedly been eating dinner when the events occurred. She was noted to have 2 such syncopal episodes where she became unresponsive with her head down. The episodes lasted about 1 minute. During these events patient was noted to never have lost pulse. Upon arrival of the fire department patient was awoken, but keep stating that she did not know what had happened. Some time after these episodes patient had one episode of vomiting. Patient denies any complaints when asked. Family notes that this is normal for the patient. She had previously just been hospitalized for hypernatremia and possible urinary tract infection from 3/27 thur 3/30. Family notes that since that time she had been initially placed on Linzess for constipation but had subsequent diarrhea so this medication was stopped. They report decreased overall energy in the last week in which patient was more sleepy than normal and had some lower abdominal pain. At baseline patient's unable to complete all of her ADLs and lives in the assisted living part of [facility].  Upon admission patient was seen have vitals within normal limits heart rate noted to be as low as 57. Lab work revealed WBC of 5, hemoglobin 10.9, potassium 3.1, lactic acid 1.96, and all other lab work relatively unremarkable. Chest x-ray showed a left lower lobe pneumonia and possible right infiltrate as well. CT scan was negative for any acute  infarct. Urinalysis positive many bacteria, trace leukocyte esterase, positive nitrites, 0-5 squamous epithelial cells, and 0-5 WBCs. [facility] called to admit.   Review of Systems  Unable to perform ROS: dementia  Gastrointestinal: Positive for abdominal pain and diarrhea.  Genitourinary: Negative for urgency.  Neurological: Positive for loss of consciousness.    Past Medical History  Diagnosis Date  . Osteoporosis   . Dry mouth     husband states she has issue with saliva glands and has to take medicaion fot hsi  . Forgetfulness   . Hypothyroidism   . Diabetes insipidus [facility])      Past Surgical History  Procedure Laterality Date  . None        Social History:  reports that she has never smoked. She does not have any smokeless tobacco history on file. She reports that she does not drink alcohol or use illicit drugs. Where does patient live--[facility]   Can patient participate in ADLs? Yes  Allergies  Allergen Reactions  . Keflex [Cephalexin] Other (See Comments)    Transcribed from office notes faxed to short stay as "critical"  . Fosamax [Alendronate Sodium] Other (See Comments)    Transcribed from office notes as "critical"    Family History  Problem Relation Age of Onset  . Alzheimer's disease Mother   . Alzheimer's disease Brother         Prior to Admission medications   Medication Sig Start Date End Date Taking? Authorizing Provider  acetaminophen (TYLENOL) 500 MG tablet Take 1,000 mg by mouth 2 (two) times daily as needed for mild pain.   Yes Historical Provider, MD  aspirin 81 MG chewable tablet Chew 81 mg by mouth daily before supper.   Yes Historical Provider, MD  calamine lotion Apply 1 application topically daily as needed (rash).   Yes Historical Provider, MD  desmopressin (DDAVP) 0.2 MG tablet Take 0.2 mg by mouth 2 (two) times daily.   Yes Historical Provider, MD  divalproex (DEPAKOTE) 125 MG DR tablet Take 125 mg by mouth daily before  supper.    Yes Historical Provider, MD  donepezil (ARICEPT) 10 MG tablet Take 10 mg by mouth daily before supper.   Yes Historical Provider, MD  ergocalciferol (VITAMIN D2) 50000 UNITS capsule Take 50,000 Units by mouth once a week. Thursdays   Yes Historical Provider, MD  ferrous sulfate 325 (65 FE) MG tablet Take 325 mg by mouth daily with breakfast.   Yes Historical Provider, MD  levothyroxine (SYNTHROID, LEVOTHROID) 75 MCG tablet Take 75 mcg by mouth daily before breakfast.   Yes Historical Provider, MD  memantine (NAMENDA) 10 MG tablet Take 10 mg by mouth daily.   Yes Historical Provider, MD  Multiple Vitamin (MULTIVITAMIN WITH MINERALS) TABS tablet Take 1 tablet by mouth daily before supper.   Yes Historical Provider, MD  pravastatin (PRAVACHOL) 20 MG tablet Take 20 mg by mouth daily before supper.    Yes Historical Provider, MD  traZODone (DESYREL) 50 MG tablet Take 50 mg by mouth daily before supper.    Yes Historical Provider, MD  Zinc Oxide (BALMEX EX) Apply 1 application topically 2 (two) times daily. Apply to right lower leg and eczema areas twice daily   Yes Historical Provider, MD     Physical Exam: Filed Vitals:   12/04/15 1841 12/04/15 1855 12/04/15 1915 12/04/15 2100  BP:   154/64 132/56  Pulse: 66  59 57  Temp:  97.8 F (36.6 C)    TempSrc:  Oral    Resp: 18  15 13   SpO2: 94%  96% 97%     Constitutional: Vital signs reviewed. Patient is a well-developed and well-nourished elderly female in no acute distress and cooperative with exam. Alert.  Head: Normocephalic and atraumatic  Ear: TM normal bilaterally  Mouth: no erythema or exudates, MMM  Eyes: PERRL, EOMI, conjunctivae normal, No scleral icterus.  Neck: Supple, Trachea midline normal ROM, No JVD, mass, thyromegaly, or carotid bruit present.  Cardiovascular: RRR, S1 normal, S2 normal, no MRG, pulses symmetric and intact bilaterally  Pulmonary/Chest: CTAB, no wheezes, rales, or rhonchi  Abdominal: Soft. Tenderness  to palpation of the lower quadrants, non-distended, bowel sounds are normal, no masses, organomegaly, or guarding present.  GU: no CVA tenderness Musculoskeletal: No joint deformities, erythema, or stiffness, ROM full and no nontender Ext: no edema and no cyanosis, pulses palpable bilaterally (DP and PT)  Hematology: no cervical, inginal, or axillary adenopathy.  Neurological: Alert, Strenght is normal and symmetric bilaterally, cranial nerve II-XII are grossly intact, no focal motor deficit, sensory intact to light touch bilaterally.  Skin: Warm, dry and intact. No rash, cyanosis, or clubbing.  Psychiatric: Normal mood and affect. speech and behavior is normal. Judgment and thought content normal. Patient has dementia.     Data Review   Micro Results No results found for this or any previous visit (from the past 240 hour(s)).  Radiology Reports Dg Chest 2 View  12/04/2015  CLINICAL DATA:  Basilar arouse on physical examination. Unresponsiveness and diagonal breathing. EXAM: CHEST - 2 VIEW COMPARISON:  11/20/2015 FINDINGS: progressive airspace infiltrate in the left lower lung, likely predominantly  in the lower lobe but also potentially in the lingula. Findings are consistent with pneumonia. There also is increased opacity at the right lung base, less severe. This could also represent an acute infiltrate. The heart size and mediastinal contours are normal. No pleural effusions or pneumothorax. The visualized skeletal structures are unremarkable. IMPRESSION: Left lower lung pneumonia. Also potential milder right basilar infiltrate. Electronically Signed   By: Irish Lack M.D.   On: 12/04/2015 19:45   Ct Head Wo Contrast  12/04/2015  CLINICAL DATA:  Acute onset of altered mental status. Vomiting. Initial encounter. EXAM: CT HEAD WITHOUT CONTRAST TECHNIQUE: Contiguous axial images were obtained from the base of the skull through the vertex without intravenous contrast. COMPARISON:  None.  FINDINGS: There is no evidence of acute infarction, mass lesion, or intra- or extra-axial hemorrhage on CT. Prominence of ventricles and sulci reflects mild to moderate cortical volume loss. Scattered periventricular white matter change likely reflects small vessel ischemic microangiopathy. The brainstem and fourth ventricle are within normal limits. The basal ganglia are unremarkable in appearance. The cerebral hemispheres demonstrate grossly normal gray-white differentiation. No mass effect or midline shift is seen. There is no evidence of fracture; visualized osseous structures are unremarkable in appearance. The visualized portions of the orbits are within normal limits. The paranasal sinuses and mastoid air cells are well-aerated. No significant soft tissue abnormalities are seen. IMPRESSION: 1. No acute intracranial pathology seen on CT. 2. Mild to moderate cortical volume loss and scattered small vessel ischemic microangiopathy. Electronically Signed   By: Roanna Raider M.D.   On: 12/04/2015 20:14   US Renal  11/21/2015  CLINICAL DATA:  Increasing creatinine EXAM: RENAL / URINARY TRACT ULTRASOUND COMPLETE COMPARISON:  None. FINDINGS: Right Kidney: Length: 11 cm. Echogenicity within normal limits. 2.3 x 1.7 x 2 cm anechoic right inferior pole renal mass with a thin septation most consistent with a cyst. No hydronephrosis visualized. Left Kidney: Length: 10.6 cm. Echogenicity within normal limits. No mass or hydronephrosis visualized. Bladder: Appears normal for degree of bladder distention. IMPRESSION: 1. No obstructive uropathy. Electronically Signed   By: Elige Ko   On: 11/21/2015 14:26   Dg Chest Port 1 View  11/20/2015  CLINICAL DATA:  Altered level of consciousness, hypernatremia, tachycardia, confusion, history dementia EXAM: PORTABLE CHEST 1 VIEW COMPARISON:  Portable exam 2050 hours compared to 02/21/2015 FINDINGS: Normal heart size, mediastinal contours, and pulmonary vascularity. Minimal  atelectasis at RIGHT base. Lungs otherwise clear. No pleural effusion or pneumothorax. Bones demineralized. IMPRESSION: Minimal RIGHT basilar atelectasis. Electronically Signed   By: Ulyses Southward M.D.   On: 11/20/2015 21:09     CBC  Recent Labs Lab 12/04/15 1901  WBC 5.0  HGB 10.9*  HCT 33.0*  PLT 179  MCV 95.7  MCH 31.6  MCHC 33.0  RDW 13.8    Chemistries   Recent Labs Lab 12/04/15 1901  NA 135  K 3.1*  CL 98*  CO2 27  GLUCOSE 135*  BUN 13  CREATININE 0.70  CALCIUM 9.0   ------------------------------------------------------------------------------------------------------------------ CrCl cannot be calculated (Unknown ideal weight.). ------------------------------------------------------------------------------------------------------------------ No results for input(s): HGBA1C in the last 72 hours. ------------------------------------------------------------------------------------------------------------------ No results for input(s): CHOL, HDL, LDLCALC, TRIG, CHOLHDL, LDLDIRECT in the last 72 hours. ------------------------------------------------------------------------------------------------------------------  Recent Labs  12/04/15 1953  TSH 5.850*   ------------------------------------------------------------------------------------------------------------------ No results for input(s): VITAMINB12, FOLATE, FERRITIN, TIBC, IRON, RETICCTPCT in the last 72 hours.  Coagulation profile No results for input(s): INR, PROTIME in the last 168 hours.  No  results for input(s): DDIMER in the last 72 hours.  Cardiac Enzymes No results for input(s): CKMB, TROPONINI, MYOGLOBIN in the last 168 hours.  Invalid input(s): CK ------------------------------------------------------------------------------------------------------------------ Invalid input(s): POCBNP   CBG:  Recent Labs Lab 12/04/15 1842  GLUCAP 124*       EKG: Independently reviewed. Sinus  rhythm with possible conduction delay   Assessment/Plan Syncopal episodes: Acute. Question underlying cause patient seen to have possible signs of infections including possible UTI and pneumonia. - Admit to telemetry bed - Check orthostatic vital signs - Trend troponins  Possible HCAP: Acute. Patient with recent hospitalization seen to have a left lower lobe pneumonia and possible right lung infiltrate on chest x-ray. - Check sputum cultures if able - Check pro calcitonin - Empiric antibiotics of vancomycin and aztreonam(allergy to cephalosporins)  Possible urinary tract infection: Present on admission. Patient with - Follow-up urine culture - Patient should have adequate coverage with antibiotics seen above  Lower abdominal pain: Acute suspected this could be secondary to the above versus constipation. - Tramadol prn pain - Check x-ray of abdomen   Nausea and vomiting: Patient had one episode of presyncopal events. - Zofran prn   Diabetes insipidus: Sodium 135 on admission. - Continue desmopressin  Dementia - Continue donepezil and Namenda  Hypothyroidism: Previously 2.899  Approximately 13 days ago, but acutely elevated to 5.85.  - Continue Synthroid -  the acute setting with possible infection will likely continue current therapy and recheck in 6-8 weeks  Anemia of chronic disease: Hemoglobin 10.9 on admission. Previously evaluated for elevated MCV and MCH vitamin B12 level 1102 and folate 26.6. - Check iron and TIBC in a.m. - Continue iron supplement for now  Hypokalemia: Potassium 3.9 on admission. - Give 40 mEq of potassium chloride once  Hyperlipidemia - continue pravastatin  Lovenox for DVT prophylaxis   Code Status:  DO NOT RESUSCITATE Family Communication: bedside Disposition Plan: admit   Total time spent 55 minutes.Greater than 50% of this time was spent in counseling, explanation of diagnosis, planning of further management, and coordination of  care  Clydie Braun [facility] Pager [phone]  If 7PM-7AM, please contact night-coverage [email or web address] Password [facility] 12/04/2015, 9:37 PM

## 2015-12-05 DIAGNOSIS — R55 Syncope and collapse: Secondary | ICD-10-CM | POA: Diagnosis not present

## 2015-12-05 DIAGNOSIS — Y95 Nosocomial condition: Secondary | ICD-10-CM | POA: Diagnosis not present

## 2015-12-05 DIAGNOSIS — Z66 Do not resuscitate: Secondary | ICD-10-CM | POA: Diagnosis not present

## 2015-12-05 DIAGNOSIS — R112 Nausea with vomiting, unspecified: Secondary | ICD-10-CM | POA: Diagnosis not present

## 2015-12-05 DIAGNOSIS — B9620 Unspecified Escherichia coli [E. coli] as the cause of diseases classified elsewhere: Secondary | ICD-10-CM | POA: Diagnosis not present

## 2015-12-05 DIAGNOSIS — Z7982 Long term (current) use of aspirin: Secondary | ICD-10-CM | POA: Diagnosis not present

## 2015-12-05 DIAGNOSIS — E232 Diabetes insipidus: Secondary | ICD-10-CM | POA: Diagnosis not present

## 2015-12-05 DIAGNOSIS — Z888 Allergy status to other drugs, medicaments and biological substances status: Secondary | ICD-10-CM | POA: Diagnosis not present

## 2015-12-05 DIAGNOSIS — E039 Hypothyroidism, unspecified: Secondary | ICD-10-CM | POA: Diagnosis not present

## 2015-12-05 DIAGNOSIS — Z820 Family history of epilepsy and other diseases of the nervous system: Secondary | ICD-10-CM | POA: Diagnosis not present

## 2015-12-05 DIAGNOSIS — R1030 Lower abdominal pain, unspecified: Secondary | ICD-10-CM | POA: Diagnosis not present

## 2015-12-05 DIAGNOSIS — N390 Urinary tract infection, site not specified: Secondary | ICD-10-CM | POA: Diagnosis not present

## 2015-12-05 DIAGNOSIS — R404 Transient alteration of awareness: Secondary | ICD-10-CM | POA: Diagnosis not present

## 2015-12-05 DIAGNOSIS — J189 Pneumonia, unspecified organism: Secondary | ICD-10-CM | POA: Diagnosis not present

## 2015-12-05 DIAGNOSIS — M810 Age-related osteoporosis without current pathological fracture: Secondary | ICD-10-CM | POA: Diagnosis not present

## 2015-12-05 DIAGNOSIS — D638 Anemia in other chronic diseases classified elsewhere: Secondary | ICD-10-CM | POA: Diagnosis not present

## 2015-12-05 DIAGNOSIS — E785 Hyperlipidemia, unspecified: Secondary | ICD-10-CM | POA: Diagnosis not present

## 2015-12-05 DIAGNOSIS — R4182 Altered mental status, unspecified: Secondary | ICD-10-CM | POA: Diagnosis not present

## 2015-12-05 DIAGNOSIS — F0390 Unspecified dementia without behavioral disturbance: Secondary | ICD-10-CM | POA: Diagnosis not present

## 2015-12-05 DIAGNOSIS — Z881 Allergy status to other antibiotic agents status: Secondary | ICD-10-CM | POA: Diagnosis not present

## 2015-12-05 LAB — CBC
HEMATOCRIT: 30.9 % — AB (ref 36.0–46.0)
Hemoglobin: 10.4 g/dL — ABNORMAL LOW (ref 12.0–15.0)
MCH: 31.6 pg (ref 26.0–34.0)
MCHC: 33.7 g/dL (ref 30.0–36.0)
MCV: 93.9 fL (ref 78.0–100.0)
PLATELETS: 200 10*3/uL (ref 150–400)
RBC: 3.29 MIL/uL — AB (ref 3.87–5.11)
RDW: 13.6 % (ref 11.5–15.5)
WBC: 7.6 10*3/uL (ref 4.0–10.5)

## 2015-12-05 LAB — COMPREHENSIVE METABOLIC PANEL
ALBUMIN: 2.9 g/dL — AB (ref 3.5–5.0)
ALT: 18 U/L (ref 14–54)
AST: 26 U/L (ref 15–41)
Alkaline Phosphatase: 53 U/L (ref 38–126)
Anion gap: 9 (ref 5–15)
BUN: 8 mg/dL (ref 6–20)
CHLORIDE: 101 mmol/L (ref 101–111)
CO2: 26 mmol/L (ref 22–32)
CREATININE: 0.59 mg/dL (ref 0.44–1.00)
Calcium: 8.8 mg/dL — ABNORMAL LOW (ref 8.9–10.3)
GFR calc Af Amer: >60 mL/min (ref >60)
GLUCOSE: 110 mg/dL — AB (ref 65–99)
POTASSIUM: 3.8 mmol/L (ref 3.5–5.1)
Sodium: 136 mmol/L (ref 135–145)
Total Bilirubin: 0.9 mg/dL (ref 0.3–1.2)
Total Protein: 5.9 g/dL — ABNORMAL LOW (ref 6.5–8.1)

## 2015-12-05 LAB — IRON AND TIBC
IRON: 138 ug/dL (ref 28–170)
Saturation Ratios: 47 % — ABNORMAL HIGH (ref 10.4–31.8)
TIBC: 293 ug/dL (ref 250–450)
UIBC: 155 ug/dL

## 2015-12-05 LAB — TROPONIN I: Troponin I: <0.03 ng/mL (ref <0.031)

## 2015-12-05 MED ORDER — ENOXAPARIN SODIUM 40 MG/0.4ML ~~LOC~~ SOLN
40.0000 mg | SUBCUTANEOUS | Status: DC
  Administered 2015-12-06 – 2015-12-08 (×3): 40 mg via SUBCUTANEOUS
  Filled 2015-12-05 (×3): qty 0.4

## 2015-12-05 MED ORDER — SODIUM CHLORIDE 0.9 % IV BOLUS (SEPSIS)
500.0000 mL | Freq: Once | INTRAVENOUS | Status: AC
  Administered 2015-12-05: 500 mL via INTRAVENOUS

## 2015-12-05 NOTE — Care Management Obs Status (Signed)
MEDICARE OBSERVATION STATUS NOTIFICATION   Patient Details  Name: Christina Garcia MRN: [id number] Date of Birth: 10-30-1932   Medicare Observation Status Notification Given:  Yes reviewed with [place] patient's niece over the phone, original left in patient's room.    Lawerance Sabal, RN 12/05/2015, 12:36 PM

## 2015-12-05 NOTE — Clinical Social Work Note (Signed)
Clinical Social Work Assessment  Patient Details  Name: Christina Garcia MRN: [id number] Date of Birth: Feb 15, 1933  Date of referral:  12/05/15               Reason for consult:  Facility Placement                Permission sought to share information with:  Case Manager, Magazine features editor, Family Supports Permission granted to share information::  Yes, Release of Information Signed  Name::      Psychologist, clinical)  Agency::     Relationship::   (niece (POA))  Contact Information:   [phone])  Housing/Transportation Living arrangements for the past 2 months:  Assisted Living Facility ([facility] Source of Information:   (patient niece) Patient Interpreter Needed:  None Criminal Activity/Legal Involvement Pertinent to Current Situation/Hospitalization:  No - Comment as needed Significant Relationships:   (neice and nephew) Lives with:  Facility Resident Do you feel safe going back to the place where you live?  Yes Need for family participation in patient care:  Yes (Comment)  Care giving concerns: seeing if patient can return to ASL ([facility]   Social Worker assessment / plan:  BSW intern completed assessment via telephone with niece [place]. Patient niece is also (POA). Patient niece is agreeable to aunt returning back to [facility] when d/c. Patient niece stated the aunt has been a [facility] Since december 10th and loves the staff and facility.  Employment status:  Retired Database administrator PT Recommendations:   (ASL) Information / Referral to community resources:  Skilled Nursing Facility  Patient/Family's Response to care:  Patient niece has a good response to care.  Patient/Family's Understanding of and Emotional Response to Diagnosis, Current Treatment, and Prognosis:  Patient niece has good insight on patient condition and current treatment.   Emotional Assessment Appearance:   (not able to  assess) Attitude/Demeanor/Rapport:  Unable to Assess Affect (typically observed):  Unable to Assess Orientation:  Oriented to Self Alcohol / Substance use:  Never Used Psych involvement (Current and /or in the community):  No (Comment)  Discharge Needs  Concerns to be addressed:  No discharge needs identified Readmission within the last 30 days:  No Current discharge risk:  None Barriers to Discharge:  No Barriers Identified   Catheryn Bacon  BSW intern  [phone]

## 2015-12-05 NOTE — Care Management Note (Addendum)
Case Management Note  Patient Details  Name: Christina Garcia MRN: [id number] Date of Birth: Oct 12, 1932  Subjective/Objective:                 Patient from [facility] placed in obs with syncope   Action/Plan:  Will dc to ALF when medically stable  Expected Discharge Date:                  Expected Discharge Plan:  Skilled Nursing Facility  In-House Referral:  Clinical Social Work  Discharge planning Services  CM Consult  Post Acute Care Choice:    Choice offered to:     DME Arranged:    DME Agency:     HH Arranged:    HH Agency:     Status of Service:  Completed, signed off  Medicare Important Message Given:    Date Medicare IM Given:    Medicare IM give by:    Date Additional Medicare IM Given:    Additional Medicare Important Message give by:     If discussed at Long Length of Stay Meetings, dates discussed:    Additional Comments:  Lawerance Sabal, RN 12/05/2015, 3:12 PM

## 2015-12-05 NOTE — NC FL2 (Signed)
[place] MEDICAID FL2 LEVEL OF CARE SCREENING TOOL     IDENTIFICATION  Patient Name: Christina Garcia Birthdate: [birth year] Sex: female Admission Date (Current Location): 12/04/2015  [facility] and [place] Number:  Producer, television/film/video and Address:         Provider Number:    Attending Physician Name and Address:  [name], MD  Relative Name and Phone Number:   [place] Niece [phone])    Current Level of Care: Hospital Recommended Level of Care: Skilled Nursing Facility Prior Approval Number:    Date Approved/Denied:   PASRR Number:    Discharge Plan: SNF    Current Diagnoses: Patient Active Problem List   Diagnosis Date Noted  . Syncope 12/04/2015  . HCAP (healthcare-associated pneumonia) 12/04/2015  . UTI (urinary tract infection) 12/04/2015  . Creatinine elevation   . Hypernatremia 11/20/2015  . AKI (acute kidney injury) (HCC) 11/20/2015  . Diabetes insipidus (HCC) 11/20/2015  . Dementia 11/20/2015  . Hypothyroidism 11/20/2015    Orientation RESPIRATION BLADDER Height & Weight     Self  Normal Incontinent Weight: 114 lb 14.4 oz (52.118 kg) Height:  4' 11.45" (151 cm)  BEHAVIORAL SYMPTOMS/MOOD NEUROLOGICAL BOWEL NUTRITION STATUS      Continent    AMBULATORY STATUS COMMUNICATION OF NEEDS Skin   Limited Assist Verbally Normal                       Personal Care Assistance Level of Assistance  Bathing, Feeding, Dressing Bathing Assistance: Maximum assistance Feeding assistance: Limited assistance Dressing Assistance: Limited assistance     Functional Limitations Info  Sight, Hearing, Speech Sight Info: Adequate Hearing Info: Adequate Speech Info: Adequate    SPECIAL CARE FACTORS FREQUENCY                       Contractures      Additional Factors Info  Code Status, Allergies (DNR) Code Status Info: DNR Allergies Info: Keflex, Fosamax           Current Medications (12/05/2015):  This is the current hospital  active medication list Current Facility-Administered Medications  Medication Dose Route Frequency Provider Last Rate Last Dose  . acetaminophen (TYLENOL) tablet 650 mg  650 mg Oral Q6H PRN Clydie Braun, MD       Or  . acetaminophen (TYLENOL) suppository 650 mg  650 mg Rectal Q6H PRN Clydie Braun, MD      . albuterol (PROVENTIL) (2.5 MG/3ML) 0.083% nebulizer solution 2.5 mg  2.5 mg Nebulization Q2H PRN Clydie Braun, MD      . aspirin chewable tablet 81 mg  81 mg Oral QAC supper Rondell A Katrinka Blazing, MD      . aztreonam (AZACTAM) 1 g in dextrose 5 % 50 mL IVPB  1 g Intravenous 3 times per day Rosaland Lao, RPH   1 g at 12/05/15 0559  . calamine lotion 1 application  1 application Topical Daily PRN Clydie Braun, MD      . desmopressin (DDAVP) tablet 0.2 mg  0.2 mg Oral BID Rondell A Katrinka Blazing, MD      . divalproex (DEPAKOTE) DR tablet 125 mg  125 mg Oral QAC supper Rondell A Katrinka Blazing, MD      . donepezil (ARICEPT) tablet 10 mg  10 mg Oral QAC supper Rondell A Katrinka Blazing, MD      . enoxaparin (LOVENOX) injection 30 mg  30 mg Subcutaneous Q24H Clydie Braun, MD      .  ferrous sulfate tablet 325 mg  325 mg Oral Q breakfast Clydie Braun, MD   325 mg at 12/05/15 0813  . guaiFENesin (MUCINEX) 12 hr tablet 600 mg  600 mg Oral BID Clydie Braun, MD   600 mg at 12/04/15 2341  . ipratropium (ATROVENT) nebulizer solution 0.5 mg  0.5 mg Nebulization Q2H PRN Clydie Braun, MD      . levothyroxine (SYNTHROID, LEVOTHROID) tablet 75 mcg  75 mcg Oral QAC breakfast Clydie Braun, MD   75 mcg at 12/05/15 0813  . memantine (NAMENDA) tablet 10 mg  10 mg Oral Daily Clydie Braun, MD   0 mg at 12/04/15 2325  . multivitamin with minerals tablet 1 tablet  1 tablet Oral QAC supper Clydie Braun, MD      . ondansetron (ZOFRAN) tablet 4 mg  4 mg Oral Q6H PRN Clydie Braun, MD       Or  . ondansetron (ZOFRAN) injection 4 mg  4 mg Intravenous Q6H PRN Clydie Braun, MD      . pravastatin (PRAVACHOL) tablet  20 mg  20 mg Oral QAC supper Rondell A Katrinka Blazing, MD      . sodium chloride flush (NS) 0.9 % injection 3 mL  3 mL Intravenous Q12H Clydie Braun, MD   3 mL at 12/04/15 2343  . traMADol (ULTRAM) tablet 50 mg  50 mg Oral Q6H PRN Clydie Braun, MD      . traZODone (DESYREL) tablet 50 mg  50 mg Oral QAC supper Rondell A Katrinka Blazing, MD      . vancomycin (VANCOCIN) IVPB 750 mg/150 ml premix  750 mg Intravenous Q24H Faye Ramsay Rumbarger, RPH      . zinc oxide (BALMEX) 11.3 % cream   Topical BID Clydie Braun, MD         Discharge Medications: Please see discharge summary for a list of discharge medications.  Relevant Imaging Results:  Relevant Lab Results:   Additional Information SS# [id number]  Catheryn Bacon  BSW intern  [phone]

## 2015-12-05 NOTE — Progress Notes (Signed)
Notified on call provider of patient's BP of 85/38. Orders to administer bolus and page back with BP post bolus.

## 2015-12-05 NOTE — Progress Notes (Signed)
NURSING PROGRESS NOTE  Christina Venuti ScottMRN: [id number] Admission Data: 12/04/15 at 11:45PM Attending Provider: No att. providers found PCP: Garlan Fillers, MD Code status: DNR  Allergies:  Allergies  Allergen Reactions  . Keflex [Cephalexin] Other (See Comments)    Transcribed from office notes faxed to short stay as "critical"  . Fosamax [Alendronate Sodium] Other (See Comments)    Transcribed from office notes as "critical"    Past Medical History:  Past Medical History  Diagnosis Date  . Osteoporosis   . Dry mouth     husband states she has issue with saliva glands and has to take medicaion fot hsi  . Forgetfulness   . Hypothyroidism   . Diabetes insipidus [facility])     Past Surgical History:  Past Surgical History  Procedure Laterality Date  . None      Christina Garcia is a 82 y.o. female patient, arrived to floor in room 5W07 via stretcher, transferred from ED. Patient is alert and oriented X1. No acute distress noted. Does not exhibit any physical signs of pain at this time. Pt is resting comfortably in bed at this time.   Vital signs: Oral temperature 97.9 F (36.6 C), Blood pressure 141/58, Pulse 61, RR 14, SpO2 97 % on room air. Height 4'11", weight 52.118 kg.   Cardiac monitoring: Telemetry box 5W # 07 in place.  IV access: Right AC and Right hand; condition patent and no redness.  Skin: intact, no pressure ulcer noted in sacral area.   Patient's ID armband verified with patient and in place. Information packet given to patient. Fall risk assessed, SR up X2, explained to patient to press the call bell to call the nurse or staff to assist before getting out of bed. Call bell within reach.

## 2015-12-05 NOTE — Progress Notes (Signed)
[facility] PROGRESS NOTE  Christina Garcia PJA:[id number] DOB: November 11, 1932 DOA: 12/04/2015 PCP: Garlan Fillers, MD  Assessment/Plan: Principal Problem:     HCAP (healthcare-associated pneumonia) - We'll treat with current IV antibiotic regimen. Will continue -Awaiting sputum culture    UTI (urinary tract infection) - Follow-up with urine culture results. For now continue antibiotic regimen listed above  Syncope - will reassess after rehydrating and plan on obtaining orthostatic vital signs  Active Problems:   Diabetes insipidus (HCC) - Continue current regimen    Dementia - We'll plan on continuing current regimen     Hypothyroidism - Continue Synthroid replacement   Code Status: DO NOT RESUSCITATE Family Communication: Discussed directly with patient Disposition Plan: Pending improvement in condition   Consultants:  None  Procedures:  None  Antibiotics:  Aztreonam and vancomycin  HPI/Subjective: Patient has limited responses to examiner and is resting comfortably on exam  Objective: Filed Vitals:   12/05/15 1540 12/05/15 1645  BP:    Pulse:    Temp: 100.1 F (37.8 C) 100.2 F (37.9 C)  Resp:      Intake/Output Summary (Last 24 hours) at 12/05/15 1650 Last data filed at 12/05/15 1619  Gross per 24 hour  Intake    320 ml  Output      0 ml  Net    320 ml   Filed Weights   12/04/15 2300 12/04/15 2345  Weight: 46 kg (101 lb 6.6 oz) 52.118 kg (114 lb 14.4 oz)    Exam:   General:  Patient in no acute distress  Cardiovascular: rrr, no rubs  Respiratory: no increased wob, no wheezes  Abdomen: soft, nt, nd  Musculoskeletal: no cyanosis   Data Reviewed: Basic Metabolic Panel:  Recent Labs Lab 12/04/15 1901 12/05/15 0510  NA 135 136  K 3.1* 3.8  CL 98* 101  CO2 27 26  GLUCOSE 135* 110*  BUN 13 8  CREATININE 0.70 0.59  CALCIUM 9.0 8.8*   Liver Function Tests:  Recent Labs Lab 12/05/15 0510  AST 26  ALT 18  ALKPHOS 53   BILITOT 0.9  PROT 5.9*  ALBUMIN 2.9*   No results for input(s): LIPASE, AMYLASE in the last 168 hours. No results for input(s): AMMONIA in the last 168 hours. CBC:  Recent Labs Lab 12/04/15 1901 12/05/15 0510  WBC 5.0 7.6  HGB 10.9* 10.4*  HCT 33.0* 30.9*  MCV 95.7 93.9  PLT 179 200   Cardiac Enzymes:  Recent Labs Lab 12/04/15 2351  TROPONINI <0.03   BNP (last 3 results)  Recent Labs  02/21/15 1842  BNP 43.6    ProBNP (last 3 results) No results for input(s): PROBNP in the last 8760 hours.  CBG:  Recent Labs Lab 12/04/15 1842  GLUCAP 124*    Recent Results (from the past 240 hour(s))  Culture, blood (routine x 2) Call MD if unable to obtain prior to antibiotics being given     Status: None (Preliminary result)   Collection Time: 12/04/15 10:08 PM  Result Value Ref Range Status   Specimen Description BLOOD RIGHT HAND  Final   Special Requests BOTTLES DRAWN AEROBIC AND ANAEROBIC 4CC  Final   Culture NO GROWTH < 24 HOURS  Final   Report Status PENDING  Incomplete  Culture, blood (routine x 2) Call MD if unable to obtain prior to antibiotics being given     Status: None (Preliminary result)   Collection Time: 12/04/15 10:10 PM  Result Value Ref Range Status  Specimen Description BLOOD LEFT HAND  Final   Special Requests BOTTLES DRAWN AEROBIC AND ANAEROBIC 5CC  Final   Culture NO GROWTH < 24 HOURS  Final   Report Status PENDING  Incomplete     Studies: Dg Chest 2 View  12/04/2015  CLINICAL DATA:  Basilar arouse on physical examination. Unresponsiveness and diagonal breathing. EXAM: CHEST - 2 VIEW COMPARISON:  11/20/2015 FINDINGS: progressive airspace infiltrate in the left lower lung, likely predominantly in the lower lobe but also potentially in the lingula. Findings are consistent with pneumonia. There also is increased opacity at the right lung base, less severe. This could also represent an acute infiltrate. The heart size and mediastinal contours are  normal. No pleural effusions or pneumothorax. The visualized skeletal structures are unremarkable. IMPRESSION: Left lower lung pneumonia. Also potential milder right basilar infiltrate. Electronically Signed   By: Irish Lack M.D.   On: 12/04/2015 19:45   Ct Head Wo Contrast  12/04/2015  CLINICAL DATA:  Acute onset of altered mental status. Vomiting. Initial encounter. EXAM: CT HEAD WITHOUT CONTRAST TECHNIQUE: Contiguous axial images were obtained from the base of the skull through the vertex without intravenous contrast. COMPARISON:  None. FINDINGS: There is no evidence of acute infarction, mass lesion, or intra- or extra-axial hemorrhage on CT. Prominence of ventricles and sulci reflects mild to moderate cortical volume loss. Scattered periventricular white matter change likely reflects small vessel ischemic microangiopathy. The brainstem and fourth ventricle are within normal limits. The basal ganglia are unremarkable in appearance. The cerebral hemispheres demonstrate grossly normal gray-white differentiation. No mass effect or midline shift is seen. There is no evidence of fracture; visualized osseous structures are unremarkable in appearance. The visualized portions of the orbits are within normal limits. The paranasal sinuses and mastoid air cells are well-aerated. No significant soft tissue abnormalities are seen. IMPRESSION: 1. No acute intracranial pathology seen on CT. 2. Mild to moderate cortical volume loss and scattered small vessel ischemic microangiopathy. Electronically Signed   By: Roanna Raider M.D.   On: 12/04/2015 20:14   Dg Abd Portable 1v  12/04/2015  CLINICAL DATA:  One day history of lower abdominal pain EXAM: PORTABLE ABDOMEN - 1 VIEW COMPARISON:  CT abdomen and pelvis June 02, 2013 FINDINGS: There is moderate stool throughout the colon. There is no bowel dilatation or air-fluid level suggesting obstruction. No free air is seen on this supine examination. There are vascular  calcifications in the pelvis. IMPRESSION: Bowel gas pattern unremarkable without obstruction or free air. Moderate stool throughout colon. Electronically Signed   By: Bretta Bang III M.D.   On: 12/04/2015 23:32    Scheduled Meds: . aspirin  81 mg Oral QAC supper  . aztreonam  1 g Intravenous 3 times per day  . desmopressin  0.2 mg Oral BID  . divalproex  125 mg Oral QAC supper  . donepezil  10 mg Oral QAC supper  . [START ON 12/06/2015] enoxaparin (LOVENOX) injection  40 mg Subcutaneous Q24H  . ferrous sulfate  325 mg Oral Q breakfast  . guaiFENesin  600 mg Oral BID  . levothyroxine  75 mcg Oral QAC breakfast  . memantine  10 mg Oral Daily  . multivitamin with minerals  1 tablet Oral QAC supper  . pravastatin  20 mg Oral QAC supper  . sodium chloride flush  3 mL Intravenous Q12H  . traZODone  50 mg Oral QAC supper  . vancomycin  750 mg Intravenous Q24H  . zinc oxide  Topical BID   Continuous Infusions:   Time spent: > 35 minutes  [name]  [facility] Pager [phone] If 7PM-7AM, please contact night-coverage at [email or web address], password [facility] 12/05/2015, 4:50 PM

## 2015-12-05 NOTE — Evaluation (Signed)
Physical Therapy Evaluation Patient Details Name: Christina Garcia MRN: [id number] DOB: November 03, 1932 Today's Date: 12/05/2015   History of Present Illness  Pt adm from ALF after syncopal episode. Pt with possible PNA. PMH - dementia, diabetes insipidous  Clinical Impression  Pt admitted with above diagnosis and presents to PT with functional limitations due to deficits listed below (See PT problem list). Pt needs skilled PT to maximize independence and safety to allow discharge back to ALF. Will follow acutely for PT to maximize functional independence for return to ALF but will not need PT at dc.    Follow Up Recommendations No PT follow up (Return to ALF)    Equipment Recommendations  None recommended by PT    Recommendations for Other Services       Precautions / Restrictions Precautions Precautions: Fall Restrictions Weight Bearing Restrictions: No      Mobility  Bed Mobility Overal bed mobility: Needs Assistance Bed Mobility: Supine to Sit     Supine to sit: Min assist     General bed mobility comments: Assist to initiate movement and to elevate trunk into sitting due to inability to follow commands  Transfers Overall transfer level: Needs assistance Equipment used: None Transfers: Sit to/from Stand Sit to Stand: Min guard         General transfer comment: Assist to initiate due to cognition  Ambulation/Gait Ambulation/Gait assistance: Min guard Ambulation Distance (Feet): 150 Feet Assistive device: None Gait Pattern/deviations: Step-through pattern;Decreased stride length;Drifts right/left     General Gait Details: Assist for safety due to dementia. No loss of balance  Stairs            Wheelchair Mobility    Modified Rankin (Stroke Patients Only)       Balance Overall balance assessment: Needs assistance Sitting-balance support: No upper extremity supported;Feet supported Sitting balance-Leahy Scale: Good     Standing balance support: No  upper extremity supported;During functional activity Standing balance-Leahy Scale: Good Standing balance comment: able to bend over and pick up object off of the floor                             Pertinent Vitals/Pain Pain Assessment: Faces Faces Pain Scale: No hurt    Home Living Family/patient expects to be discharged to:: Assisted living               Home Equipment: None Additional Comments:      Prior Function Level of Independence: Needs assistance   Gait / Transfers Assistance Needed: Amb modified independent without assistive device           Hand Dominance        Extremity/Trunk Assessment   Upper Extremity Assessment: Overall WFL for tasks assessed           Lower Extremity Assessment: Overall WFL for tasks assessed      Cervical / Trunk Assessment: Kyphotic  Communication      Cognition Arousal/Alertness: Awake/alert Behavior During Therapy: WFL for tasks assessed/performed Overall Cognitive Status: No family/caregiver present to determine baseline cognitive functioning                 General Comments: Only finds occasional commands with tactile and visual cues    General Comments      Exercises        Assessment/Plan    PT Assessment Patient needs continued PT services  PT Diagnosis Abnormality of gait   PT Problem List  Decreased balance;Decreased mobility  PT Treatment Interventions Gait training;Functional mobility training;Therapeutic activities;Balance training;Patient/family education   PT Goals (Current goals can be found in the Care Plan section) Acute Rehab PT Goals Patient Stated Goal: Pt unable to state PT Goal Formulation: Patient unable to participate in goal setting Time For Goal Achievement: 12/12/15 Potential to Achieve Goals: Good    Frequency Min 3X/week   Barriers to discharge        Co-evaluation               End of Session Equipment Utilized During Treatment: Gait  belt Activity Tolerance: Patient tolerated treatment well Patient left: in chair;with call bell/phone within reach;with chair alarm set Nurse Communication: Mobility status    Functional Assessment Tool Used: clinical judgement Functional Limitation: Mobility: Walking and moving around Mobility: Walking and Moving Around Current Status [phone]): At least 1 percent but less than 20 percent impaired, limited or restricted Mobility: Walking and Moving Around Goal Status [phone]): At least 1 percent but less than 20 percent impaired, limited or restricted    Time: 0857-0918 PT Time Calculation (min) (ACUTE ONLY): 21 min   Charges:   PT Evaluation $PT Eval Moderate Complexity: 1 Procedure     PT G Codes:   PT G-Codes **NOT FOR INPATIENT CLASS** Functional Assessment Tool Used: clinical judgement Functional Limitation: Mobility: Walking and moving around Mobility: Walking and Moving Around Current Status ([id number]): At least 1 percent but less than 20 percent impaired, limited or restricted Mobility: Walking and Moving Around Goal Status [phone]): At least 1 percent but less than 20 percent impaired, limited or restricted    [facility] 12/05/2015, 9:46 AM [facility] PT [phone]

## 2015-12-06 DIAGNOSIS — E039 Hypothyroidism, unspecified: Secondary | ICD-10-CM

## 2015-12-06 LAB — BASIC METABOLIC PANEL
Anion gap: 8 (ref 5–15)
BUN: 11 mg/dL (ref 6–20)
CHLORIDE: 106 mmol/L (ref 101–111)
CO2: 27 mmol/L (ref 22–32)
CREATININE: 0.71 mg/dL (ref 0.44–1.00)
Calcium: 8.9 mg/dL (ref 8.9–10.3)
GFR calc Af Amer: >60 mL/min (ref >60)
GFR calc non Af Amer: >60 mL/min (ref >60)
GLUCOSE: 97 mg/dL (ref 65–99)
POTASSIUM: 3.9 mmol/L (ref 3.5–5.1)
Sodium: 141 mmol/L (ref 135–145)

## 2015-12-06 LAB — CBC
HEMATOCRIT: 29.4 % — AB (ref 36.0–46.0)
HEMOGLOBIN: 9.8 g/dL — AB (ref 12.0–15.0)
MCH: 32.2 pg (ref 26.0–34.0)
MCHC: 33.3 g/dL (ref 30.0–36.0)
MCV: 96.7 fL (ref 78.0–100.0)
Platelets: 183 10*3/uL (ref 150–400)
RBC: 3.04 MIL/uL — AB (ref 3.87–5.11)
RDW: 14.1 % (ref 11.5–15.5)
WBC: 3.7 10*3/uL — ABNORMAL LOW (ref 4.0–10.5)

## 2015-12-06 LAB — PROCALCITONIN

## 2015-12-06 NOTE — Progress Notes (Addendum)
Notified MD of patient not voiding this shift. Bladder scan revealed more than . Awaiting for MD return page.

## 2015-12-06 NOTE — Progress Notes (Signed)
Notified on call of patient not voiding all shift; bladder scan revealed . Awaiting return page

## 2015-12-06 NOTE — Progress Notes (Signed)
Second bladder scan revealed 555 ml. MD notified and he recommended In and Out x 1. Were removed 750 ml. Will continue to monitor.

## 2015-12-06 NOTE — Progress Notes (Signed)
[facility] PROGRESS NOTE  Christina Garcia QIH:[id number] DOB: November 09, 1932 DOA: 12/04/2015 PCP: Garlan Fillers, MD  Assessment/Plan: Principal Problem:     HCAP (healthcare-associated pneumonia) - Continue current IV antibiotic regimen. Improving on current regimen. - Awaiting sputum culture    UTI (urinary tract infection) - Growing E coli on evaluation of urine culture. Sensitivities pending. For now continue antibiotic regimen listed above  Syncope - will reassess after rehydrating and plan on obtaining orthostatic vital signs  Active Problems:   Diabetes insipidus (HCC) - Continue current regimen    Dementia - We'll plan on continuing current regimen     Hypothyroidism - Continue Synthroid replacement  Code Status: DO NOT RESUSCITATE Family Communication: Discussed directly with patient Disposition Plan: Pending improvement in condition   Consultants:  None  Procedures:  None  Antibiotics:  Aztreonam and vancomycin  HPI/Subjective: Patient has no new complaints. Is more alert.  Objective: Filed Vitals:   12/06/15 0557 12/06/15 1430  BP: 126/55 134/57  Pulse: 68 68  Temp: 97.8 F (36.6 C) 97.9 F (36.6 C)  Resp: 18     Intake/Output Summary (Last 24 hours) at 12/06/15 1630 Last data filed at 12/06/15 1100  Gross per 24 hour  Intake    280 ml  Output    700 ml  Net   -420 ml   Filed Weights   12/04/15 2300 12/04/15 2345  Weight: 46 kg (101 lb 6.6 oz) 52.118 kg (114 lb 14.4 oz)    Exam:   General:  Patient in no acute distress  Cardiovascular: rrr, no rubs  Respiratory: no increased wob, no wheezes  Abdomen: soft, nt, nd  Musculoskeletal: no cyanosis   Data Reviewed: Basic Metabolic Panel:  Recent Labs Lab 12/04/15 1901 12/05/15 0510 12/06/15 0850  NA 135 136 141  K 3.1* 3.8 3.9  CL 98* 101 106  CO2 27 26 27   GLUCOSE 135* 110* 97  BUN 13 8 11   CREATININE 0.70 0.59 0.71  CALCIUM 9.0 8.8* 8.9   Liver Function  Tests:  Recent Labs Lab 12/05/15 0510  AST 26  ALT 18  ALKPHOS 53  BILITOT 0.9  PROT 5.9*  ALBUMIN 2.9*   No results for input(s): LIPASE, AMYLASE in the last 168 hours. No results for input(s): AMMONIA in the last 168 hours. CBC:  Recent Labs Lab 12/04/15 1901 12/05/15 0510 12/06/15 0850  WBC 5.0 7.6 3.7*  HGB 10.9* 10.4* 9.8*  HCT 33.0* 30.9* 29.4*  MCV 95.7 93.9 96.7  PLT 179 200 183   Cardiac Enzymes:  Recent Labs Lab 12/04/15 2351  TROPONINI <0.03   BNP (last 3 results)  Recent Labs  02/21/15 1842  BNP 43.6    ProBNP (last 3 results) No results for input(s): PROBNP in the last 8760 hours.  CBG:  Recent Labs Lab 12/04/15 1842  GLUCAP 124*    Recent Results (from the past 240 hour(s))  Culture, Urine     Status: Abnormal (Preliminary result)   Collection Time: 12/04/15  8:00 PM  Result Value Ref Range Status   Specimen Description URINE, CATHETERIZED  Final   Special Requests NONE  Final   Culture >=100,000 COLONIES/mL ESCHERICHIA COLI (A)  Final   Report Status PENDING  Incomplete  Culture, blood (routine x 2) Call MD if unable to obtain prior to antibiotics being given     Status: None (Preliminary result)   Collection Time: 12/04/15 10:08 PM  Result Value Ref Range Status   Specimen Description BLOOD  RIGHT HAND  Final   Special Requests BOTTLES DRAWN AEROBIC AND ANAEROBIC 4CC  Final   Culture NO GROWTH 2 DAYS  Final   Report Status PENDING  Incomplete  Culture, blood (routine x 2) Call MD if unable to obtain prior to antibiotics being given     Status: None (Preliminary result)   Collection Time: 12/04/15 10:10 PM  Result Value Ref Range Status   Specimen Description BLOOD LEFT HAND  Final   Special Requests BOTTLES DRAWN AEROBIC AND ANAEROBIC 5CC  Final   Culture NO GROWTH 2 DAYS  Final   Report Status PENDING  Incomplete     Studies: Dg Chest 2 View  12/04/2015  CLINICAL DATA:  Basilar arouse on physical examination.  Unresponsiveness and diagonal breathing. EXAM: CHEST - 2 VIEW COMPARISON:  11/20/2015 FINDINGS: progressive airspace infiltrate in the left lower lung, likely predominantly in the lower lobe but also potentially in the lingula. Findings are consistent with pneumonia. There also is increased opacity at the right lung base, less severe. This could also represent an acute infiltrate. The heart size and mediastinal contours are normal. No pleural effusions or pneumothorax. The visualized skeletal structures are unremarkable. IMPRESSION: Left lower lung pneumonia. Also potential milder right basilar infiltrate. Electronically Signed   By: Irish Lack M.D.   On: 12/04/2015 19:45   Ct Head Wo Contrast  12/04/2015  CLINICAL DATA:  Acute onset of altered mental status. Vomiting. Initial encounter. EXAM: CT HEAD WITHOUT CONTRAST TECHNIQUE: Contiguous axial images were obtained from the base of the skull through the vertex without intravenous contrast. COMPARISON:  None. FINDINGS: There is no evidence of acute infarction, mass lesion, or intra- or extra-axial hemorrhage on CT. Prominence of ventricles and sulci reflects mild to moderate cortical volume loss. Scattered periventricular white matter change likely reflects small vessel ischemic microangiopathy. The brainstem and fourth ventricle are within normal limits. The basal ganglia are unremarkable in appearance. The cerebral hemispheres demonstrate grossly normal gray-white differentiation. No mass effect or midline shift is seen. There is no evidence of fracture; visualized osseous structures are unremarkable in appearance. The visualized portions of the orbits are within normal limits. The paranasal sinuses and mastoid air cells are well-aerated. No significant soft tissue abnormalities are seen. IMPRESSION: 1. No acute intracranial pathology seen on CT. 2. Mild to moderate cortical volume loss and scattered small vessel ischemic microangiopathy. Electronically  Signed   By: Roanna Raider M.D.   On: 12/04/2015 20:14   Dg Abd Portable 1v  12/04/2015  CLINICAL DATA:  One day history of lower abdominal pain EXAM: PORTABLE ABDOMEN - 1 VIEW COMPARISON:  CT abdomen and pelvis June 02, 2013 FINDINGS: There is moderate stool throughout the colon. There is no bowel dilatation or air-fluid level suggesting obstruction. No free air is seen on this supine examination. There are vascular calcifications in the pelvis. IMPRESSION: Bowel gas pattern unremarkable without obstruction or free air. Moderate stool throughout colon. Electronically Signed   By: Bretta Bang III M.D.   On: 12/04/2015 23:32    Scheduled Meds: . aspirin  81 mg Oral QAC supper  . aztreonam  1 g Intravenous 3 times per day  . desmopressin  0.2 mg Oral BID  . divalproex  125 mg Oral QAC supper  . donepezil  10 mg Oral QAC supper  . enoxaparin (LOVENOX) injection  40 mg Subcutaneous Q24H  . ferrous sulfate  325 mg Oral Q breakfast  . guaiFENesin  600 mg Oral BID  .  levothyroxine  75 mcg Oral QAC breakfast  . memantine  10 mg Oral Daily  . multivitamin with minerals  1 tablet Oral QAC supper  . pravastatin  20 mg Oral QAC supper  . sodium chloride flush  3 mL Intravenous Q12H  . traZODone  50 mg Oral QAC supper  . vancomycin  750 mg Intravenous Q24H  . zinc oxide   Topical BID   Continuous Infusions:   Time spent: > 35 minutes  [name]  [facility] Pager [phone] If 7PM-7AM, please contact night-coverage at [email or web address], password [facility] 12/06/2015, 4:30 PM  LOS: 1 day

## 2015-12-06 NOTE — Progress Notes (Signed)
Physical Therapy Treatment Patient Details Name: Christina Garcia MRN: [id number] DOB: Apr 06, 1933 Today's Date: 12/06/2015    History of Present Illness Pt adm from ALF after syncopal episode. Pt with possible PNA. PMH - dementia, diabetes insipidous    PT Comments    Pt continues to do well with mobility with no LOB. Limited by cognitive deficits. RW good distraction from pulling on IV during session. Continue to follow until d/c.   Follow Up Recommendations  No PT follow up (Return to ALF)     Equipment Recommendations  None recommended by PT    Recommendations for Other Services       Precautions / Restrictions Precautions Precautions: Fall Restrictions Weight Bearing Restrictions: No    Mobility  Bed Mobility Overal bed mobility: Needs Assistance Bed Mobility: Supine to Sit     Supine to sit: Min assist     General bed mobility comments: assis to bring bilat LE to EOB and elevate trunk into sitting and scoot hips forward; multimodal cues to initiate movements; pt with tendency to lie on L side   Transfers Overall transfer level: Needs assistance Equipment used: None Transfers: Sit to/from Stand Sit to Stand: Min guard         General transfer comment: cues to initiate stand but no physical assist required  Ambulation/Gait Ambulation/Gait assistance: Min guard;Min assist Ambulation Distance (Feet): 180 Feet Assistive device: Rolling walker (2 wheeled) Gait Pattern/deviations: Step-through pattern;Decreased stride length     General Gait Details: assist for safety and guidance of RW at times; no unsteadiness noted with head turns; needs visual cues for guidance   Stairs            Wheelchair Mobility    Modified Rankin (Stroke Patients Only)       Balance Overall balance assessment: Needs assistance Sitting-balance support: No upper extremity supported;Feet supported Sitting balance-Leahy Scale: Good     Standing balance support: No  upper extremity supported Standing balance-Leahy Scale: Good                      Cognition Arousal/Alertness: Awake/alert Behavior During Therapy: WFL for tasks assessed/performed Overall Cognitive Status: No family/caregiver present to determine baseline cognitive functioning Area of Impairment: Orientation;Memory;Following commands;Safety/judgement Orientation Level: Disoriented to;Place;Time;Situation   Memory: Decreased short-term memory Following Commands: Follows one step commands inconsistently;Follows one step commands with increased time Safety/Judgement: Decreased awareness of safety     General Comments: follows commands inconsistently and required multimodal cues    Exercises      General Comments        Pertinent Vitals/Pain Pain Assessment: Faces Faces Pain Scale: No hurt    Home Living                      Prior Function            PT Goals (current goals can now be found in the care plan section) Acute Rehab PT Goals Patient Stated Goal: Pt unable to state PT Goal Formulation: Patient unable to participate in goal setting Time For Goal Achievement: 12/12/15 Potential to Achieve Goals: Good Progress towards PT goals: Progressing toward goals    Frequency  Min 3X/week    PT Plan Current plan remains appropriate    Co-evaluation             End of Session Equipment Utilized During Treatment: Gait belt Activity Tolerance: Patient tolerated treatment well Patient left: in bed;with call bell/phone within  reach;with bed alarm set     Time: 2831-5176 PT Time Calculation (min) (ACUTE ONLY): 22 min  Charges:  $Gait Training: 8-22 mins                    G Codes:     Derek Mound, PTA Pager: [phone]   12/06/2015, 4:29 PM

## 2015-12-07 LAB — URINE CULTURE

## 2015-12-07 MED ORDER — LEVOFLOXACIN IN D5W 750 MG/150ML IV SOLN
750.0000 mg | INTRAVENOUS | Status: DC
  Administered 2015-12-07: 750 mg via INTRAVENOUS
  Filled 2015-12-07: qty 150

## 2015-12-07 NOTE — Progress Notes (Signed)
[facility] PROGRESS NOTE  Christina Garcia RCV:[id number] DOB: May 31, 1933 DOA: 12/04/2015 PCP: Garlan Fillers, MD  Assessment/Plan: Principal Problem:     HCAP (healthcare-associated pneumonia) - Will transition to levaquin - Awaiting sputum culture    UTI (urinary tract infection) - Growing pan sensitive E coli. Will place on levaquin  Syncope - PT on board. Pt to transition to ALF.  Active Problems:   Diabetes insipidus (HCC) - Continue current regimen    Dementia - We'll plan on continuing current regimen     Hypothyroidism - Continue Synthroid replacement  Code Status: DO NOT RESUSCITATE Family Communication: Discussed directly with patient Disposition Plan: Most likely d/c next am.   Consultants:  None  Procedures:  None  Antibiotics:  Aztreonam and vancomycin>>> Levaquin 12/07/15  HPI/Subjective: Patient has no new complaints. Is more alert.  Objective: Filed Vitals:   12/07/15 0519 12/07/15 1130  BP: 126/51 124/56  Pulse: 73 64  Temp: 99.2 F (37.3 C) 98.4 F (36.9 C)  Resp: 16 14    Intake/Output Summary (Last 24 hours) at 12/07/15 1602 Last data filed at 12/07/15 1100  Gross per 24 hour  Intake    710 ml  Output    875 ml  Net   -165 ml   Filed Weights   12/04/15 2300 12/04/15 2345  Weight: 46 kg (101 lb 6.6 oz) 52.118 kg (114 lb 14.4 oz)    Exam:   General:  Patient in no acute distress  Cardiovascular: rrr, no rubs  Respiratory: no increased wob, no wheezes  Abdomen: soft, nt, nd  Musculoskeletal: no cyanosis   Data Reviewed: Basic Metabolic Panel:  Recent Labs Lab 12/04/15 1901 12/05/15 0510 12/06/15 0850  NA 135 136 141  K 3.1* 3.8 3.9  CL 98* 101 106  CO2 27 26 27   GLUCOSE 135* 110* 97  BUN 13 8 11   CREATININE 0.70 0.59 0.71  CALCIUM 9.0 8.8* 8.9   Liver Function Tests:  Recent Labs Lab 12/05/15 0510  AST 26  ALT 18  ALKPHOS 53  BILITOT 0.9  PROT 5.9*  ALBUMIN 2.9*   No results for  input(s): LIPASE, AMYLASE in the last 168 hours. No results for input(s): AMMONIA in the last 168 hours. CBC:  Recent Labs Lab 12/04/15 1901 12/05/15 0510 12/06/15 0850  WBC 5.0 7.6 3.7*  HGB 10.9* 10.4* 9.8*  HCT 33.0* 30.9* 29.4*  MCV 95.7 93.9 96.7  PLT 179 200 183   Cardiac Enzymes:  Recent Labs Lab 12/04/15 2351  TROPONINI <0.03   BNP (last 3 results)  Recent Labs  02/21/15 1842  BNP 43.6    ProBNP (last 3 results) No results for input(s): PROBNP in the last 8760 hours.  CBG:  Recent Labs Lab 12/04/15 1842  GLUCAP 124*    Recent Results (from the past 240 hour(s))  Culture, Urine     Status: Abnormal   Collection Time: 12/04/15  8:00 PM  Result Value Ref Range Status   Specimen Description URINE, CATHETERIZED  Final   Special Requests NONE  Final   Culture >=100,000 COLONIES/mL ESCHERICHIA COLI (A)  Final   Report Status 12/07/2015 FINAL  Final   Organism ID, Bacteria ESCHERICHIA COLI (A)  Final      Susceptibility   Escherichia coli - MIC*    AMPICILLIN <=2 SENSITIVE Sensitive     CEFAZOLIN <=4 SENSITIVE Sensitive     CEFTRIAXONE <=1 SENSITIVE Sensitive     CIPROFLOXACIN <=0.25 SENSITIVE Sensitive  GENTAMICIN <=1 SENSITIVE Sensitive     IMIPENEM <=0.25 SENSITIVE Sensitive     NITROFURANTOIN <=16 SENSITIVE Sensitive     TRIMETH/SULFA >=320 RESISTANT Resistant     AMPICILLIN/SULBACTAM <=2 SENSITIVE Sensitive     PIP/TAZO <=4 SENSITIVE Sensitive     * >=100,000 COLONIES/mL ESCHERICHIA COLI  Culture, blood (routine x 2) Call MD if unable to obtain prior to antibiotics being given     Status: None (Preliminary result)   Collection Time: 12/04/15 10:08 PM  Result Value Ref Range Status   Specimen Description BLOOD RIGHT HAND  Final   Special Requests BOTTLES DRAWN AEROBIC AND ANAEROBIC 4CC  Final   Culture NO GROWTH 3 DAYS  Final   Report Status PENDING  Incomplete  Culture, blood (routine x 2) Call MD if unable to obtain prior to antibiotics  being given     Status: None (Preliminary result)   Collection Time: 12/04/15 10:10 PM  Result Value Ref Range Status   Specimen Description BLOOD LEFT HAND  Final   Special Requests BOTTLES DRAWN AEROBIC AND ANAEROBIC 5CC  Final   Culture NO GROWTH 3 DAYS  Final   Report Status PENDING  Incomplete     Studies: No results found.  Scheduled Meds: . aspirin  81 mg Oral QAC supper  . desmopressin  0.2 mg Oral BID  . divalproex  125 mg Oral QAC supper  . donepezil  10 mg Oral QAC supper  . enoxaparin (LOVENOX) injection  40 mg Subcutaneous Q24H  . ferrous sulfate  325 mg Oral Q breakfast  . guaiFENesin  600 mg Oral BID  . levofloxacin (LEVAQUIN) IV  750 mg Intravenous Q48H  . levothyroxine  75 mcg Oral QAC breakfast  . memantine  10 mg Oral Daily  . multivitamin with minerals  1 tablet Oral QAC supper  . pravastatin  20 mg Oral QAC supper  . sodium chloride flush  3 mL Intravenous Q12H  . traZODone  50 mg Oral QAC supper  . zinc oxide   Topical BID   Continuous Infusions:   Time spent: > 35 minutes  Christina Garcia  [facility] Pager [phone] If 7PM-7AM, please contact night-coverage at [email or web address], password [email or web address] 12/07/2015, 4:02 PM  LOS: 2 days

## 2015-12-07 NOTE — Progress Notes (Signed)
Pt has be experiencing some urinary retention and has been straight cath the past 2 shifts. Pt has been toileted a total of 3 times during this shift and has went a little each time. First bladder scan showed 564 ml. Pt was then encouraged to use the bathroom. post void bladder scan was 417 ml. NP Craige Cotta made aware. No new orders at this time

## 2015-12-07 NOTE — Consult Note (Signed)
Christina Codispoti EdD 

## 2015-12-07 NOTE — Progress Notes (Signed)
Pharmacy Antibiotic Note  Christina Garcia is a 82 y.o. female admitted on 12/04/2015 with pneumonia on CXR, Ecoli UTI.  Pharmacy has been consulted for levo dosing. Abx D#4. Afebrile, WBC low 3.8. SCr 0.59>0.71. PCT remains undectable.  Plan: - D/c vanc/aztreonam >> Levofloxacin 750mg  IV q48h - F/u renal fxn, C&S, clinical status  Height: 4' 11.45" (151 cm) Weight: 114 lb 14.4 oz (52.118 kg) IBW/kg (Calculated) : 44.23  Temp (24hrs), Avg:98.7 F (37.1 C), Min:98.4 F (36.9 C), Max:99.2 F (37.3 C)   Recent Labs Lab 12/04/15 1901 12/04/15 2119 12/05/15 0510 12/06/15 0850  WBC 5.0  --  7.6 3.7*  CREATININE 0.70  --  0.59 0.71  LATICACIDVEN  --  1.96  --   --     Estimated Creatinine Clearance: 38.5 mL/min (by C-G formula based on Cr of 0.71).    Allergies  Allergen Reactions  . Keflex [Cephalexin] Other (See Comments)    Transcribed from office notes faxed to short stay as "critical"  . Fosamax [Alendronate Sodium] Other (See Comments)    Transcribed from office notes as "critical"    Antimicrobials this admission: Vanc 4/10>>4/13 Aztreo 4/10>>4/13 Levo 4/13>>  Dose adjustments this admission: N/A  Microbiology results: 4/10 UC:>100k col Ecoli (R-bactrim) 4/10 BCx2: ngtd 4/10 Sputum: no result  6/10, PharmD, [facility] Clinical Pharmacist Pager [phone] 12/07/2015 3:37 PM

## 2015-12-07 NOTE — Progress Notes (Signed)
Physical Therapy Treatment Patient Details Name: JAHARA DAIL MRN: [id number] DOB: Dec 29, 1932 Today's Date: 12/17/15    History of Present Illness Pt adm from ALF after syncopal episode. Pt with possible PNA. PMH - dementia, diabetes insipidous    PT Comments    Pt making steady progress.  Follow Up Recommendations  No PT follow up (Return to ALF)     Equipment Recommendations  None recommended by PT    Recommendations for Other Services       Precautions / Restrictions Precautions Precautions: Fall Restrictions Weight Bearing Restrictions: No    Mobility  Bed Mobility Overal bed mobility: Needs Assistance Bed Mobility: Supine to Sit     Supine to sit: Mod assist     General bed mobility comments: Assist to initiate and elevate trunk into sitting due to cognitive deficits  Transfers Overall transfer level: Needs assistance Equipment used: None Transfers: Sit to/from Stand Sit to Stand: Min guard         General transfer comment: cues to initiate stand but no physical assist required  Ambulation/Gait Ambulation/Gait assistance: Min guard;Supervision Ambulation Distance (Feet): 200 Feet Assistive device: None Gait Pattern/deviations: Step-through pattern;Decreased stride length;Drifts right/left     General Gait Details: Assist for safety   Stairs            Wheelchair Mobility    Modified Rankin (Stroke Patients Only)       Balance Overall balance assessment: Needs assistance Sitting-balance support: No upper extremity supported;Feet supported Sitting balance-Leahy Scale: Good     Standing balance support: No upper extremity supported Standing balance-Leahy Scale: Good                      Cognition Arousal/Alertness: Awake/alert Behavior During Therapy: WFL for tasks assessed/performed Overall Cognitive Status: No family/caregiver present to determine baseline cognitive functioning Area of Impairment:  Orientation;Memory;Following commands;Safety/judgement Orientation Level: Disoriented to;Place;Time;Situation   Memory: Decreased short-term memory Following Commands: Follows one step commands inconsistently;Follows one step commands with increased time Safety/Judgement: Decreased awareness of safety     General Comments: follows commands inconsistently and required multimodal cues    Exercises      General Comments        Pertinent Vitals/Pain Pain Assessment: Faces Faces Pain Scale: No hurt    Home Living                      Prior Function            PT Goals (current goals can now be found in the care plan section) Progress towards PT goals: Progressing toward goals    Frequency  Min 3X/week    PT Plan Current plan remains appropriate    Co-evaluation             End of Session Equipment Utilized During Treatment: Gait belt Activity Tolerance: Patient tolerated treatment well Patient left: with call bell/phone within reach;in chair;with chair alarm set     Time: 1213-1226 PT Time Calculation (min) (ACUTE ONLY): 13 min  Charges:  $Gait Training: 8-22 mins                    G Codes:      Alejandra Barna 12-17-15, 1:41 PM [employer] PT [phone]

## 2015-12-07 NOTE — Progress Notes (Signed)
Pharmacy Antibiotic Note  Christina Garcia is a 82 y.o. female admitted on 12/04/2015 with pneumonia.  Pharmacy has been consulted for vancomycin dosing. Also on aztreonam per MD. Abx D#4. Afebrile, WBC low 3.8. SCr 0.59>0.71. PCT remains undectable. UC growing Ecoli, BC neg to date.  Plan: - Vancomycin 750mg  IV Q24H - d/c today? - Aztreonam 1gm IV Q8H - F/u renal fxn, C&S, clinical status, and trough at SS  Height: 4' 11.45" (151 cm) Weight: 114 lb 14.4 oz (52.118 kg) IBW/kg (Calculated) : 44.23  Temp (24hrs), Avg:98.5 F (36.9 C), Min:97.9 F (36.6 C), Max:99.2 F (37.3 C)   Recent Labs Lab 12/04/15 1901 12/04/15 2119 12/05/15 0510 12/06/15 0850  WBC 5.0  --  7.6 3.7*  CREATININE 0.70  --  0.59 0.71  LATICACIDVEN  --  1.96  --   --     Estimated Creatinine Clearance: 38.5 mL/min (by C-G formula based on Cr of 0.71).    Allergies  Allergen Reactions  . Keflex [Cephalexin] Other (See Comments)    Transcribed from office notes faxed to short stay as "critical"  . Fosamax [Alendronate Sodium] Other (See Comments)    Transcribed from office notes as "critical"    Antimicrobials this admission: Vanc 4/10>> Aztreo 4/10>>  Dose adjustments this admission: N/A  Microbiology results: 4/10 UC:>100k col Ecoli (S-pending) 4/10 BCx2: ngtd 4/10 Sputum: no result  6/10, PharmD, [facility] Clinical Pharmacist Pager [phone] 12/07/2015 9:07 AM

## 2015-12-08 ENCOUNTER — Encounter (HOSPITAL_COMMUNITY): Payer: Self-pay

## 2015-12-08 LAB — PROCALCITONIN

## 2015-12-08 MED ORDER — LEVOFLOXACIN 750 MG PO TABS: 750.0000 mg | ORAL_TABLET | ORAL | Status: DC

## 2015-12-08 NOTE — Progress Notes (Signed)
Pt prepared for d/c to SNF. IV d/c'd. Foley d/c'd. Skin intact except as charted in most recent assessments. Vitals are stable. Report called to receiving facility. Pt to be transported by family to receiving facility.

## 2015-12-08 NOTE — NC FL2 (Signed)
[place] MEDICAID FL2 LEVEL OF CARE SCREENING TOOL     IDENTIFICATION  Patient Name: Christina Garcia Birthdate: 1933/02/28 Sex: female Admission Date (Current Location): 12/04/2015  [facility] [place] and [place] Number:  Producer, television/film/video and Address:  [facility] [place], [address] [place], [place] [address]      Provider Number: [id number]  Attending Physician Name and Address:  [name], MD  Relative Name and Phone Number:  Karoline Caldwell, niece, [phone]    Current Level of Care: Hospital Recommended Level of Care: Assisted Living Facility Prior Approval Number:    Date Approved/Denied:   PASRR Number:    Discharge Plan: Other (Comment) (ALF)    Current Diagnoses: Patient Active Problem List   Diagnosis Date Noted  . Syncope 12/04/2015  . HCAP (healthcare-associated pneumonia) 12/04/2015  . UTI (urinary tract infection) 12/04/2015  . Creatinine elevation   . Hypernatremia 11/20/2015  . AKI (acute kidney injury) (HCC) 11/20/2015  . Diabetes insipidus (HCC) 11/20/2015  . Dementia 11/20/2015  . Hypothyroidism 11/20/2015    Orientation RESPIRATION BLADDER Height & Weight     Self  Normal Incontinent Weight: 114 lb 14.4 oz (52.118 kg) Height:  4' 11.45" (151 cm)  BEHAVIORAL SYMPTOMS/MOOD NEUROLOGICAL BOWEL NUTRITION STATUS      Incontinent Diet (Regular)  AMBULATORY STATUS COMMUNICATION OF NEEDS Skin   Limited Assist Verbally Normal                       Personal Care Assistance Level of Assistance  Bathing, Feeding, Dressing Bathing Assistance: Maximum assistance Feeding assistance: Limited assistance Dressing Assistance: Limited assistance     Functional Limitations Info  Sight, Hearing, Speech Sight Info: Adequate Hearing Info: Adequate Speech Info: Adequate    SPECIAL CARE FACTORS FREQUENCY                       Contractures      Additional Factors Info  Code Status, Allergies Code Status Info: DNR Allergies  Info: Keflex, Fosamax           Current Medications (12/08/2015):   Discharge Medications: START taking these medications   Details  levofloxacin (LEVAQUIN) 750 MG tablet Take 1 tablet (750 mg total) by mouth every other day. Qty: 2 tablet, Refills: 0      CONTINUE these medications which have NOT CHANGED   Details  acetaminophen (TYLENOL) 500 MG tablet Take 1,000 mg by mouth 2 (two) times daily as needed for mild pain.    aspirin 81 MG chewable tablet Chew 81 mg by mouth daily before supper.    calamine lotion Apply 1 application topically daily as needed (rash).    desmopressin (DDAVP) 0.2 MG tablet Take 0.2 mg by mouth 2 (two) times daily.    divalproex (DEPAKOTE) 125 MG DR tablet Take 125 mg by mouth daily before supper.     donepezil (ARICEPT) 10 MG tablet Take 10 mg by mouth daily before supper.    ergocalciferol (VITAMIN D2) 50000 UNITS capsule Take 50,000 Units by mouth once a week. Thursdays    ferrous sulfate 325 (65 FE) MG tablet Take 325 mg by mouth daily with breakfast.    levothyroxine (SYNTHROID, LEVOTHROID) 75 MCG tablet Take 75 mcg by mouth daily before breakfast.    memantine (NAMENDA) 10 MG tablet Take 10 mg by mouth daily.    Multiple Vitamin (MULTIVITAMIN WITH MINERALS) TABS tablet Take 1 tablet by mouth daily before supper.  pravastatin (PRAVACHOL) 20 MG tablet Take 20 mg by mouth daily before supper.     traZODone (DESYREL) 50 MG tablet Take 50 mg by mouth daily before supper.     Zinc Oxide (BALMEX EX) Apply 1 application topically 2 (two) times daily. Apply to right lower leg and eczema areas twice daily       Allergies  Allergen Reactions  . Keflex [Cephalexin] Other (See Comments)    Transcribed from office notes faxed to short stay as "critical"  . Fosamax [Alendronate Sodium] Other (See Comments)    Transcribed from office notes as "critical"         Relevant  Imaging Results:  Relevant Lab Results:   Additional Information SS# [id number]  Mearl Latin, LCSWA

## 2015-12-08 NOTE — Progress Notes (Signed)
Patient will DC to: [facility] Anticipated DC date: 12/08/15 Family notified: Niece Transport by: Niece, by car  CSW signing off.  Cristobal Goldmann, [place] Clinical Social Worker [phone]

## 2015-12-08 NOTE — Care Management Note (Signed)
Case Management Note  Patient Details  Name: Christina Garcia MRN: [id number] Date of Birth: Dec 28, 1932  Subjective/Objective:                 Patient admitted from [facility] no PT follow up needed.   Action/Plan:  Will DC to ALF, no CM needs Expected Discharge Date:                  Expected Discharge Plan:  Assisted Living / Rest Home ([facility])  In-House Referral:  Clinical Social Work  Discharge planning Services  CM Consult  Post Acute Care Choice:  NA Choice offered to:     DME Arranged:    DME Agency:     HH Arranged:    HH Agency:     Status of Service:  Completed, signed off  Medicare Important Message Given:    Date Medicare IM Given:    Medicare IM give by:    Date Additional Medicare IM Given:    Additional Medicare Important Message give by:     If discussed at Long Length of Stay Meetings, dates discussed:    Additional Comments:  Lawerance Sabal, RN 12/08/2015, 10:14 AM

## 2015-12-08 NOTE — Discharge Summary (Signed)
Physician Discharge Summary  Christina Garcia NUU:[id number] DOB: 02-27-1933 DOA: 12/04/2015  PCP: Garlan Fillers, MD  Admit date: 12/04/2015 Discharge date: 12/08/2015  Time spent: > 35  minutes  Recommendations for Outpatient Follow-up:  1. Please be sure to follow up with your primary care physician at facility   Discharge Diagnoses:  Principal Problem:   Syncope Active Problems:   Diabetes insipidus (HCC)   Dementia   Hypothyroidism   HCAP (healthcare-associated pneumonia)   UTI (urinary tract infection)   Discharge Condition: stable  Diet recommendation: regular diet  Filed Weights   12/04/15 2300 12/04/15 2345  Weight: 46 kg (101 lb 6.6 oz) 52.118 kg (114 lb 14.4 oz)    History of present illness:  From original HPI: 82 year old female with a past medical history significant for dementia, diabetes insipidus, and hypothyroidism; who presented with syncope. Pt diagnosed with UTI and suspected pna.  Hospital Course:  PNA - Improved on antibiotics  UTI - Growing E coli. Improved on IV antibiotics and patient was able to transition to Levaquin and showed continued improvement.  For other known medical conditions listed above will continue medication regimen listed below  Procedures:  None  Consultations:  None  Discharge Exam: Filed Vitals:   12/07/15 2138 12/08/15 0529  BP: 136/42 137/67  Pulse: 64 60  Temp: 98.1 F (36.7 C) 98.2 F (36.8 C)  Resp: 16 16    General: Pt in nad, alert and awake. Pleasantly confused Cardiovascular: no cyanosis Respiratory: no increased wob, equal chest rise  Discharge Instructions   Discharge Instructions    Call MD for:  extreme fatigue    Complete by:  As directed      Call MD for:  severe uncontrolled pain    Complete by:  As directed      Call MD for:  temperature >100.4    Complete by:  As directed      Diet - low sodium heart healthy    Complete by:  As directed      Increase activity slowly     Complete by:  As directed           Current Discharge Medication List    START taking these medications   Details  levofloxacin (LEVAQUIN) 750 MG tablet Take 1 tablet (750 mg total) by mouth every other day. Qty: 2 tablet, Refills: 0      CONTINUE these medications which have NOT CHANGED   Details  acetaminophen (TYLENOL) 500 MG tablet Take 1,000 mg by mouth 2 (two) times daily as needed for mild pain.    aspirin 81 MG chewable tablet Chew 81 mg by mouth daily before supper.    calamine lotion Apply 1 application topically daily as needed (rash).    desmopressin (DDAVP) 0.2 MG tablet Take 0.2 mg by mouth 2 (two) times daily.    divalproex (DEPAKOTE) 125 MG DR tablet Take 125 mg by mouth daily before supper.     donepezil (ARICEPT) 10 MG tablet Take 10 mg by mouth daily before supper.    ergocalciferol (VITAMIN D2) 50000 UNITS capsule Take 50,000 Units by mouth once a week. Thursdays    ferrous sulfate 325 (65 FE) MG tablet Take 325 mg by mouth daily with breakfast.    levothyroxine (SYNTHROID, LEVOTHROID) 75 MCG tablet Take 75 mcg by mouth daily before breakfast.    memantine (NAMENDA) 10 MG tablet Take 10 mg by mouth daily.    Multiple Vitamin (MULTIVITAMIN WITH MINERALS) TABS tablet Take  1 tablet by mouth daily before supper.    pravastatin (PRAVACHOL) 20 MG tablet Take 20 mg by mouth daily before supper.     traZODone (DESYREL) 50 MG tablet Take 50 mg by mouth daily before supper.     Zinc Oxide (BALMEX EX) Apply 1 application topically 2 (two) times daily. Apply to right lower leg and eczema areas twice daily       Allergies  Allergen Reactions  . Keflex [Cephalexin] Other (See Comments)    Transcribed from office notes faxed to short stay as "critical"  . Fosamax [Alendronate Sodium] Other (See Comments)    Transcribed from office notes as "critical"      The results of significant diagnostics from this hospitalization (including imaging, microbiology,  ancillary and laboratory) are listed below for reference.    Significant Diagnostic Studies: Dg Chest 2 View  12/04/2015  CLINICAL DATA:  Basilar arouse on physical examination. Unresponsiveness and diagonal breathing. EXAM: CHEST - 2 VIEW COMPARISON:  11/20/2015 FINDINGS: progressive airspace infiltrate in the left lower lung, likely predominantly in the lower lobe but also potentially in the lingula. Findings are consistent with pneumonia. There also is increased opacity at the right lung base, less severe. This could also represent an acute infiltrate. The heart size and mediastinal contours are normal. No pleural effusions or pneumothorax. The visualized skeletal structures are unremarkable. IMPRESSION: Left lower lung pneumonia. Also potential milder right basilar infiltrate. Electronically Signed   By: Irish Lack M.D.   On: 12/04/2015 19:45   Ct Head Wo Contrast  12/04/2015  CLINICAL DATA:  Acute onset of altered mental status. Vomiting. Initial encounter. EXAM: CT HEAD WITHOUT CONTRAST TECHNIQUE: Contiguous axial images were obtained from the base of the skull through the vertex without intravenous contrast. COMPARISON:  None. FINDINGS: There is no evidence of acute infarction, mass lesion, or intra- or extra-axial hemorrhage on CT. Prominence of ventricles and sulci reflects mild to moderate cortical volume loss. Scattered periventricular white matter change likely reflects small vessel ischemic microangiopathy. The brainstem and fourth ventricle are within normal limits. The basal ganglia are unremarkable in appearance. The cerebral hemispheres demonstrate grossly normal gray-white differentiation. No mass effect or midline shift is seen. There is no evidence of fracture; visualized osseous structures are unremarkable in appearance. The visualized portions of the orbits are within normal limits. The paranasal sinuses and mastoid air cells are well-aerated. No significant soft tissue  abnormalities are seen. IMPRESSION: 1. No acute intracranial pathology seen on CT. 2. Mild to moderate cortical volume loss and scattered small vessel ischemic microangiopathy. Electronically Signed   By: Roanna Raider M.D.   On: 12/04/2015 20:14   US Renal  11/21/2015  CLINICAL DATA:  Increasing creatinine EXAM: RENAL / URINARY TRACT ULTRASOUND COMPLETE COMPARISON:  None. FINDINGS: Right Kidney: Length: 11 cm. Echogenicity within normal limits. 2.3 x 1.7 x 2 cm anechoic right inferior pole renal mass with a thin septation most consistent with a cyst. No hydronephrosis visualized. Left Kidney: Length: 10.6 cm. Echogenicity within normal limits. No mass or hydronephrosis visualized. Bladder: Appears normal for degree of bladder distention. IMPRESSION: 1. No obstructive uropathy. Electronically Signed   By: Elige Ko   On: 11/21/2015 14:26   Dg Chest Port 1 View  11/20/2015  CLINICAL DATA:  Altered level of consciousness, hypernatremia, tachycardia, confusion, history dementia EXAM: PORTABLE CHEST 1 VIEW COMPARISON:  Portable exam 2050 hours compared to 02/21/2015 FINDINGS: Normal heart size, mediastinal contours, and pulmonary vascularity. Minimal atelectasis at RIGHT  base. Lungs otherwise clear. No pleural effusion or pneumothorax. Bones demineralized. IMPRESSION: Minimal RIGHT basilar atelectasis. Electronically Signed   By: Ulyses Southward M.D.   On: 11/20/2015 21:09   Dg Abd Portable 1v  12/04/2015  CLINICAL DATA:  One day history of lower abdominal pain EXAM: PORTABLE ABDOMEN - 1 VIEW COMPARISON:  CT abdomen and pelvis June 02, 2013 FINDINGS: There is moderate stool throughout the colon. There is no bowel dilatation or air-fluid level suggesting obstruction. No free air is seen on this supine examination. There are vascular calcifications in the pelvis. IMPRESSION: Bowel gas pattern unremarkable without obstruction or free air. Moderate stool throughout colon. Electronically Signed   By: Bretta Bang III M.D.   On: 12/04/2015 23:32    Microbiology: Recent Results (from the past 240 hour(s))  Culture, Urine     Status: Abnormal   Collection Time: 12/04/15  8:00 PM  Result Value Ref Range Status   Specimen Description URINE, CATHETERIZED  Final   Special Requests NONE  Final   Culture >=100,000 COLONIES/mL ESCHERICHIA COLI (A)  Final   Report Status 12/07/2015 FINAL  Final   Organism ID, Bacteria ESCHERICHIA COLI (A)  Final      Susceptibility   Escherichia coli - MIC*    AMPICILLIN <=2 SENSITIVE Sensitive     CEFAZOLIN <=4 SENSITIVE Sensitive     CEFTRIAXONE <=1 SENSITIVE Sensitive     CIPROFLOXACIN <=0.25 SENSITIVE Sensitive     GENTAMICIN <=1 SENSITIVE Sensitive     IMIPENEM <=0.25 SENSITIVE Sensitive     NITROFURANTOIN <=16 SENSITIVE Sensitive     TRIMETH/SULFA >=320 RESISTANT Resistant     AMPICILLIN/SULBACTAM <=2 SENSITIVE Sensitive     PIP/TAZO <=4 SENSITIVE Sensitive     * >=100,000 COLONIES/mL ESCHERICHIA COLI  Culture, blood (routine x 2) Call MD if unable to obtain prior to antibiotics being given     Status: None (Preliminary result)   Collection Time: 12/04/15 10:08 PM  Result Value Ref Range Status   Specimen Description BLOOD RIGHT HAND  Final   Special Requests BOTTLES DRAWN AEROBIC AND ANAEROBIC 4CC  Final   Culture NO GROWTH 3 DAYS  Final   Report Status PENDING  Incomplete  Culture, blood (routine x 2) Call MD if unable to obtain prior to antibiotics being given     Status: None (Preliminary result)   Collection Time: 12/04/15 10:10 PM  Result Value Ref Range Status   Specimen Description BLOOD LEFT HAND  Final   Special Requests BOTTLES DRAWN AEROBIC AND ANAEROBIC 5CC  Final   Culture NO GROWTH 3 DAYS  Final   Report Status PENDING  Incomplete     Labs: Basic Metabolic Panel:  Recent Labs Lab 12/04/15 1901 12/05/15 0510 12/06/15 0850  NA 135 136 141  K 3.1* 3.8 3.9  CL 98* 101 106  CO2 27 26 27   GLUCOSE 135* 110* 97  BUN 13 8 11    CREATININE 0.70 0.59 0.71  CALCIUM 9.0 8.8* 8.9   Liver Function Tests:  Recent Labs Lab 12/05/15 0510  AST 26  ALT 18  ALKPHOS 53  BILITOT 0.9  PROT 5.9*  ALBUMIN 2.9*   No results for input(s): LIPASE, AMYLASE in the last 168 hours. No results for input(s): AMMONIA in the last 168 hours. CBC:  Recent Labs Lab 12/04/15 1901 12/05/15 0510 12/06/15 0850  WBC 5.0 7.6 3.7*  HGB 10.9* 10.4* 9.8*  HCT 33.0* 30.9* 29.4*  MCV 95.7 93.9 96.7  PLT 179  200 183   Cardiac Enzymes:  Recent Labs Lab 12/04/15 2351  TROPONINI <0.03   BNP: BNP (last 3 results)  Recent Labs  02/21/15 1842  BNP 43.6    ProBNP (last 3 results) No results for input(s): PROBNP in the last 8760 hours.  CBG:  Recent Labs Lab 12/04/15 1842  GLUCAP 124*       Signed:  [name] MD.  [facility] 12/08/2015, 10:01 AM

## 2015-12-08 NOTE — Care Management Important Message (Signed)
Important Message  Patient Details  Name: Christina Garcia MRN: [id number] Date of Birth: [birth year]   Medicare Important Message Given:  Yes    Aydien Majette Abena 12/08/2015, 11:30 AM

## 2015-12-09 LAB — CULTURE, BLOOD (ROUTINE X 2)
CULTURE: NO GROWTH
Culture: NO GROWTH

## 2015-12-29 ENCOUNTER — Encounter (HOSPITAL_COMMUNITY): Payer: Self-pay | Admitting: Emergency Medicine

## 2015-12-29 ENCOUNTER — Emergency Department (HOSPITAL_COMMUNITY): Payer: PPO

## 2015-12-29 ENCOUNTER — Inpatient Hospital Stay (HOSPITAL_COMMUNITY)
Admission: EM | Admit: 2015-12-29 | Discharge: 2016-01-02 | DRG: 644 | Disposition: A | Discharge status: Skilled Nursing Facility | Payer: PPO | Attending: Internal Medicine | Admitting: Internal Medicine

## 2015-12-29 DIAGNOSIS — F0390 Unspecified dementia without behavioral disturbance: Secondary | ICD-10-CM | POA: Diagnosis not present

## 2015-12-29 DIAGNOSIS — E785 Hyperlipidemia, unspecified: Secondary | ICD-10-CM | POA: Diagnosis not present

## 2015-12-29 DIAGNOSIS — F0391 Unspecified dementia with behavioral disturbance: Secondary | ICD-10-CM | POA: Diagnosis not present

## 2015-12-29 DIAGNOSIS — M069 Rheumatoid arthritis, unspecified: Secondary | ICD-10-CM | POA: Diagnosis not present

## 2015-12-29 DIAGNOSIS — Z7982 Long term (current) use of aspirin: Secondary | ICD-10-CM

## 2015-12-29 DIAGNOSIS — G309 Alzheimer's disease, unspecified: Secondary | ICD-10-CM | POA: Diagnosis present

## 2015-12-29 DIAGNOSIS — E8770 Fluid overload, unspecified: Secondary | ICD-10-CM | POA: Diagnosis not present

## 2015-12-29 DIAGNOSIS — N179 Acute kidney failure, unspecified: Secondary | ICD-10-CM | POA: Diagnosis present

## 2015-12-29 DIAGNOSIS — M810 Age-related osteoporosis without current pathological fracture: Secondary | ICD-10-CM | POA: Diagnosis present

## 2015-12-29 DIAGNOSIS — K219 Gastro-esophageal reflux disease without esophagitis: Secondary | ICD-10-CM | POA: Diagnosis not present

## 2015-12-29 DIAGNOSIS — E870 Hyperosmolality and hypernatremia: Secondary | ICD-10-CM | POA: Diagnosis not present

## 2015-12-29 DIAGNOSIS — R4182 Altered mental status, unspecified: Secondary | ICD-10-CM | POA: Diagnosis not present

## 2015-12-29 DIAGNOSIS — E232 Diabetes insipidus: Secondary | ICD-10-CM | POA: Diagnosis not present

## 2015-12-29 DIAGNOSIS — IMO0002 Reserved for concepts with insufficient information to code with codable children: Secondary | ICD-10-CM | POA: Diagnosis present

## 2015-12-29 DIAGNOSIS — E039 Hypothyroidism, unspecified: Secondary | ICD-10-CM | POA: Diagnosis present

## 2015-12-29 DIAGNOSIS — D641 Secondary sideroblastic anemia due to disease: Secondary | ICD-10-CM | POA: Diagnosis not present

## 2015-12-29 DIAGNOSIS — F0281 Dementia in other diseases classified elsewhere with behavioral disturbance: Secondary | ICD-10-CM | POA: Diagnosis present

## 2015-12-29 DIAGNOSIS — D649 Anemia, unspecified: Secondary | ICD-10-CM | POA: Diagnosis present

## 2015-12-29 DIAGNOSIS — R7989 Other specified abnormal findings of blood chemistry: Secondary | ICD-10-CM | POA: Diagnosis present

## 2015-12-29 DIAGNOSIS — K589 Irritable bowel syndrome without diarrhea: Secondary | ICD-10-CM | POA: Diagnosis present

## 2015-12-29 DIAGNOSIS — I10 Essential (primary) hypertension: Secondary | ICD-10-CM | POA: Diagnosis present

## 2015-12-29 DIAGNOSIS — R748 Abnormal levels of other serum enzymes: Secondary | ICD-10-CM | POA: Diagnosis not present

## 2015-12-29 HISTORY — DX: Dementia in other diseases classified elsewhere, unspecified severity, without behavioral disturbance, psychotic disturbance, mood disturbance, and anxiety: F02.80

## 2015-12-29 HISTORY — DX: Alzheimer's disease, unspecified: G30.9

## 2015-12-29 HISTORY — DX: Anemia, unspecified: D64.9

## 2015-12-29 HISTORY — DX: Insomnia, unspecified: G47.00

## 2015-12-29 HISTORY — DX: Irritable bowel syndrome without diarrhea: K58.9

## 2015-12-29 HISTORY — DX: Other intervertebral disc degeneration, lumbar region: M51.36

## 2015-12-29 HISTORY — DX: Gastro-esophageal reflux disease without esophagitis: K21.9

## 2015-12-29 HISTORY — DX: Rheumatoid arthritis, unspecified: M06.9

## 2015-12-29 LAB — CBG MONITORING, ED: Glucose-Capillary: 109 mg/dL — ABNORMAL HIGH (ref 65–99)

## 2015-12-29 LAB — BASIC METABOLIC PANEL
ANION GAP: 12 (ref 5–15)
ANION GAP: 12 (ref 5–15)
BUN: 33 mg/dL — ABNORMAL HIGH (ref 6–20)
BUN: 34 mg/dL — ABNORMAL HIGH (ref 6–20)
CALCIUM: 9.9 mg/dL (ref 8.9–10.3)
CALCIUM: 9.6 mg/dL (ref 8.9–10.3)
CO2: 32 mmol/L (ref 22–32)
CO2: 30 mmol/L (ref 22–32)
Chloride: 119 mmol/L — ABNORMAL HIGH (ref 101–111)
Chloride: 117 mmol/L — ABNORMAL HIGH (ref 101–111)
Creatinine, Ser: 1.16 mg/dL — ABNORMAL HIGH (ref 0.44–1.00)
Creatinine, Ser: 1.01 mg/dL — ABNORMAL HIGH (ref 0.44–1.00)
GFR, EST AFRICAN AMERICAN: 50 mL/min — AB (ref >60)
GFR, EST AFRICAN AMERICAN: 59 mL/min — AB (ref >60)
GFR, EST NON AFRICAN AMERICAN: 43 mL/min — AB (ref >60)
GFR, EST NON AFRICAN AMERICAN: 51 mL/min — AB (ref >60)
Glucose, Bld: 159 mg/dL — ABNORMAL HIGH (ref 65–99)
Glucose, Bld: 159 mg/dL — ABNORMAL HIGH (ref 65–99)
POTASSIUM: 3.7 mmol/L (ref 3.5–5.1)
POTASSIUM: 3.7 mmol/L (ref 3.5–5.1)
SODIUM: 159 mmol/L — AB (ref 135–145)
Sodium: 163 mmol/L (ref 135–145)

## 2015-12-29 LAB — CBC
HEMATOCRIT: 48.1 % — AB (ref 36.0–46.0)
HEMOGLOBIN: 14.1 g/dL (ref 12.0–15.0)
MCH: 31.9 pg (ref 26.0–34.0)
MCHC: 29.3 g/dL — ABNORMAL LOW (ref 30.0–36.0)
MCV: 108.8 fL — AB (ref 78.0–100.0)
PLATELETS: 183 10*3/uL (ref 150–400)
RBC: 4.42 MIL/uL (ref 3.87–5.11)
RDW: 15.5 % (ref 11.5–15.5)
WBC: 7.2 10*3/uL (ref 4.0–10.5)

## 2015-12-29 LAB — URINALYSIS, ROUTINE W REFLEX MICROSCOPIC
Bilirubin Urine: NEGATIVE
Glucose, UA: NEGATIVE mg/dL
Ketones, ur: NEGATIVE mg/dL
NITRITE: NEGATIVE
PROTEIN: 30 mg/dL — AB
SPECIFIC GRAVITY, URINE: 1.025 (ref 1.005–1.030)
pH: 6 (ref 5.0–8.0)

## 2015-12-29 LAB — OSMOLALITY: Osmolality: 353 mOsm/kg (ref 275–295)

## 2015-12-29 LAB — COMPREHENSIVE METABOLIC PANEL
ALT: 21 U/L (ref 14–54)
AST: 32 U/L (ref 15–41)
Albumin: 3.9 g/dL (ref 3.5–5.0)
Alkaline Phosphatase: 70 U/L (ref 38–126)
Anion gap: 13 (ref 5–15)
BUN: 28 mg/dL — ABNORMAL HIGH (ref 6–20)
CHLORIDE: 121 mmol/L — AB (ref 101–111)
CO2: 31 mmol/L (ref 22–32)
CREATININE: 1.27 mg/dL — AB (ref 0.44–1.00)
Calcium: 10.1 mg/dL (ref 8.9–10.3)
GFR calc non Af Amer: 38 mL/min — ABNORMAL LOW (ref >60)
GFR, EST AFRICAN AMERICAN: 45 mL/min — AB (ref >60)
Glucose, Bld: 122 mg/dL — ABNORMAL HIGH (ref 65–99)
POTASSIUM: 3.9 mmol/L (ref 3.5–5.1)
SODIUM: 165 mmol/L — AB (ref 135–145)
Total Bilirubin: 0.7 mg/dL (ref 0.3–1.2)
Total Protein: 8.1 g/dL (ref 6.5–8.1)

## 2015-12-29 LAB — URINE MICROSCOPIC-ADD ON

## 2015-12-29 LAB — OSMOLALITY, URINE: OSMOLALITY UR: 523 mosm/kg (ref 300–900)

## 2015-12-29 LAB — SODIUM, URINE, RANDOM: SODIUM UR: 30 mmol/L

## 2015-12-29 LAB — VALPROIC ACID LEVEL: VALPROIC ACID LVL: 31 ug/mL — AB (ref 50.0–100.0)

## 2015-12-29 MED ORDER — SENNOSIDES-DOCUSATE SODIUM 8.6-50 MG PO TABS
1.0000 | ORAL_TABLET | Freq: Every evening | ORAL | Status: DC | PRN
  Filled 2015-12-29: qty 1

## 2015-12-29 MED ORDER — DEXTROSE 5 % IV SOLN
INTRAVENOUS | Status: DC
  Administered 2015-12-29: 75 mL via INTRAVENOUS
  Administered 2015-12-30 – 2015-12-31 (×4): via INTRAVENOUS

## 2015-12-29 MED ORDER — DONEPEZIL HCL 10 MG PO TABS
10.0000 mg | ORAL_TABLET | Freq: Every day | ORAL | Status: DC
  Administered 2015-12-29 – 2015-12-30 (×2): 10 mg via ORAL
  Filled 2015-12-29: qty 1
  Filled 2015-12-29: qty 2

## 2015-12-29 MED ORDER — ONDANSETRON HCL 4 MG PO TABS: 4.0000 mg | ORAL_TABLET | Freq: Four times a day (QID) | ORAL | Status: DC | PRN

## 2015-12-29 MED ORDER — ERGOCALCIFEROL 1.25 MG (50000 UT) PO CAPS: 50000.0000 [IU] | ORAL_CAPSULE | ORAL | Status: DC

## 2015-12-29 MED ORDER — BISACODYL 10 MG RE SUPP: 10.0000 mg | Freq: Every day | RECTAL | Status: DC | PRN

## 2015-12-29 MED ORDER — LEVOTHYROXINE SODIUM 75 MCG PO TABS
75.0000 ug | ORAL_TABLET | Freq: Every day | ORAL | Status: DC
  Administered 2015-12-30 – 2016-01-02 (×4): 75 ug via ORAL
  Filled 2015-12-29 (×4): qty 1

## 2015-12-29 MED ORDER — ACETAMINOPHEN 650 MG RE SUPP: 650.0000 mg | Freq: Four times a day (QID) | RECTAL | Status: DC | PRN

## 2015-12-29 MED ORDER — MAGNESIUM CITRATE PO SOLN: 1.0000 | Freq: Once | ORAL | Status: DC | PRN

## 2015-12-29 MED ORDER — FERROUS SULFATE 325 (65 FE) MG PO TABS
325.0000 mg | ORAL_TABLET | Freq: Every day | ORAL | Status: DC
  Administered 2015-12-30 – 2016-01-02 (×4): 325 mg via ORAL
  Filled 2015-12-29 (×5): qty 1

## 2015-12-29 MED ORDER — DESMOPRESSIN ACETATE 0.2 MG PO TABS
0.2000 mg | ORAL_TABLET | Freq: Two times a day (BID) | ORAL | Status: DC
  Administered 2015-12-29 – 2016-01-02 (×9): 0.2 mg via ORAL
  Filled 2015-12-29 (×12): qty 1

## 2015-12-29 MED ORDER — DIVALPROEX SODIUM ER 250 MG PO TB24
250.0000 mg | ORAL_TABLET | Freq: Every day | ORAL | Status: DC
  Administered 2015-12-29 – 2016-01-01 (×4): 250 mg via ORAL
  Filled 2015-12-29 (×6): qty 1

## 2015-12-29 MED ORDER — ASPIRIN 81 MG PO CHEW
81.0000 mg | CHEWABLE_TABLET | Freq: Every day | ORAL | Status: DC
  Administered 2015-12-29 – 2016-01-01 (×4): 81 mg via ORAL
  Filled 2015-12-29 (×4): qty 1

## 2015-12-29 MED ORDER — ONDANSETRON HCL 4 MG/2ML IJ SOLN: 4.0000 mg | Freq: Four times a day (QID) | INTRAMUSCULAR | Status: DC | PRN

## 2015-12-29 MED ORDER — PRAVASTATIN SODIUM 20 MG PO TABS
20.0000 mg | ORAL_TABLET | Freq: Every day | ORAL | Status: DC
  Administered 2015-12-29 – 2016-01-01 (×4): 20 mg via ORAL
  Filled 2015-12-29: qty 0.5
  Filled 2015-12-29 (×3): qty 1

## 2015-12-29 MED ORDER — MORPHINE SULFATE (PF) 2 MG/ML IV SOLN: 1.0000 mg | INTRAVENOUS | Status: DC | PRN

## 2015-12-29 MED ORDER — HYDROCODONE-ACETAMINOPHEN 5-325 MG PO TABS
1.0000 | ORAL_TABLET | ORAL | Status: DC | PRN
  Administered 2015-12-31: 2 via ORAL
  Filled 2015-12-29: qty 2

## 2015-12-29 MED ORDER — HALOPERIDOL LACTATE 5 MG/ML IJ SOLN
2.0000 mg | Freq: Once | INTRAMUSCULAR | Status: AC
  Administered 2015-12-29: 2 mg via INTRAVENOUS
  Filled 2015-12-29: qty 1

## 2015-12-29 MED ORDER — ACETAMINOPHEN 325 MG PO TABS: 650.0000 mg | ORAL_TABLET | Freq: Four times a day (QID) | ORAL | Status: DC | PRN

## 2015-12-29 MED ORDER — SUVOREXANT 10 MG PO TABS: 10.0000 mg | ORAL_TABLET | Freq: Every day | ORAL | Status: DC

## 2015-12-29 MED ORDER — RISAQUAD PO CAPS
1.0000 | ORAL_CAPSULE | Freq: Every day | ORAL | Status: DC
  Administered 2015-12-30 – 2016-01-02 (×4): 1 via ORAL
  Filled 2015-12-29 (×5): qty 1

## 2015-12-29 NOTE — H&P (Signed)
History and Physical    Christina Garcia NID:[id number] DOB: 1932-12-22 DOA: 12/29/2015  Referring MD/NP/PA: EDP PCP: Garlan Fillers, MD  Outpatient Specialists: Patient Care Team: Jarome Matin, MD as PCP - General (Internal Medicine)  Patient coming from:  Nursing Home  Chief Complaint: Increased Confusion  HPI: Christina Garcia is a 82 y.o. female with medical history significant for Dementia, Hypothyroidism, DI on desmopressin, IBS, RA, recent UTI with hospitalization on 4/10, brought from her nursing home due to increased confusion . She was in her usual state of health until last afternoon, when she began to show increased lethargy, unsteady gait, decreased oral intake. Per daughter's report, she was afebrile. And had not respiratory or cardiac complaints prior to admission.  No lower extremity swelling. No apparent  nausea, heartburn or change in bowel habits. No reported abdominal pain. She was not urinating, had to be reinstructed to do so, but she had difficulty following commands. No  abnormal skin rashes or sick contacts.   ED Course:  BP 127/73 mmHg  Pulse 87  Temp(Src) 98.1 F (36.7 C) (Oral)  Resp 16  SpO2 97% NA 165. Cr 1.27, other labs unremarkable. CXR normal. Started on IVF at 125 cc/h. She is to be admitted for further management of her hypernatremia. Renal has been called in consultation. She will be given D5W in the interim. Review of Systems: As per HPI otherwise 10 point review of systems negative.   Past Medical History  Diagnosis Date  . Osteoporosis   . Dry mouth     husband states she has issue with saliva glands and has to take medicaion fot hsi  . Forgetfulness   . Hypothyroidism   . Diabetes insipidus (HCC)   . Alzheimer disease   . GERD (gastroesophageal reflux disease)   . IBS (irritable bowel syndrome)   . Rheumatoid arthritis (HCC)   . Anemia   . Insomnia   . DDD (degenerative disc disease), lumbar     Past Surgical History  Procedure  Laterality Date  . None    . Abdominal hysterectomy       reports that she has never smoked. She does not have any smokeless tobacco history on file. She reports that she does not drink alcohol or use illicit drugs.  Allergies  Allergen Reactions  . Keflex [Cephalexin] Other (See Comments)    Transcribed from office notes faxed to short stay as "critical"  . Fosamax [Alendronate Sodium] Other (See Comments)    Transcribed from office notes as "critical"    Family History  Problem Relation Age of Onset  . Alzheimer's disease Mother   . Alzheimer's disease Brother     Family history reviewed and not pertinent (If you reviewed it)  Prior to Admission medications   Medication Sig Start Date End Date Taking? Authorizing Provider  acetaminophen (TYLENOL) 500 MG tablet Take 1,000 mg by mouth 2 (two) times daily as needed for mild pain.   Yes Historical Provider, MD  aspirin 81 MG chewable tablet Chew 81 mg by mouth daily before supper.   Yes Historical Provider, MD  calamine lotion Apply 1 application topically daily as needed (rash).   Yes Historical Provider, MD  Cranberry 450 MG CAPS Take 450 mg by mouth 2 (two) times daily.   Yes Historical Provider, MD  desmopressin (DDAVP) 0.2 MG tablet Take 0.2 mg by mouth 2 (two) times daily.   Yes Historical Provider, MD  divalproex (DEPAKOTE ER) 250 MG 24 hr tablet Take 250  mg by mouth at bedtime.   Yes Historical Provider, MD  donepezil (ARICEPT) 10 MG tablet Take 10 mg by mouth daily before supper.   Yes Historical Provider, MD  ergocalciferol (VITAMIN D2) 50000 UNITS capsule Take 50,000 Units by mouth once a week. Thursdays   Yes Historical Provider, MD  ferrous sulfate 325 (65 FE) MG tablet Take 325 mg by mouth daily with breakfast.   Yes Historical Provider, MD  levothyroxine (SYNTHROID, LEVOTHROID) 75 MCG tablet Take 75 mcg by mouth daily before breakfast.   Yes Historical Provider, MD  magnesium hydroxide (MILK OF MAGNESIA) 400 MG/5ML  suspension Take 30 mLs by mouth at bedtime.   Yes Historical Provider, MD  Multiple Vitamin (MULTIVITAMIN WITH MINERALS) TABS tablet Take 1 tablet by mouth daily before supper.   Yes Historical Provider, MD  pravastatin (PRAVACHOL) 20 MG tablet Take 20 mg by mouth daily before supper.    Yes Historical Provider, MD  PRESCRIPTION MEDICATION Take 240 mLs by mouth 3 (three) times daily. Medpass   Yes Historical Provider, MD  Probiotic Product (PROBIOTIC PO) Take 1 tablet by mouth daily.   Yes Historical Provider, MD  sennosides-docusate sodium (SENOKOT-S) 8.6-50 MG tablet Take 1 tablet by mouth 2 (two) times daily.   Yes Historical Provider, MD  Suvorexant (BELSOMRA) 10 MG TABS Take 10 mg by mouth at bedtime.   Yes Historical Provider, MD  traZODone (DESYREL) 50 MG tablet Take 50 mg by mouth daily before supper.    Yes Historical Provider, MD  Zinc Oxide (BALMEX EX) Apply 1 application topically 2 (two) times daily. Apply to right lower leg and eczema areas twice daily   Yes Historical Provider, MD    Physical Exam:    Filed Vitals:   12/29/15 1100 12/29/15 1229 12/29/15 1230 12/29/15 1300  BP: 128/68 136/94 128/65 127/73  Pulse: 88 88 93 87  Temp:      TempSrc:      Resp: 17 13 13 16   SpO2: 93% 97% 97% 97%      Constitutional: NAD, calm, pleasantly confused  Filed Vitals:   12/29/15 1100 12/29/15 1229 12/29/15 1230 12/29/15 1300  BP: 128/68 136/94 128/65 127/73  Pulse: 88 88 93 87  Temp:      TempSrc:      Resp: 17 13 13 16   SpO2: 93% 97% 97% 97%   Eyes: PERRL, lids and conjunctivae normal ENMT: Mucous membranes are moist. Posterior pharynx clear of any exudate or lesions. Normal dentition.  Neck: normal, supple, no masses, no thyromegaly Respiratory: clear to auscultation bilaterally, no wheezing, no crackles. Normal respiratory effort. No accessory muscle use.  Cardiovascular: Regular rate and rhythm, no murmurs / rubs / gallops. No extremity edema. 2+ pedal pulses. No  carotid bruits.  Abdomen: no tenderness, no masses palpated. No hepatosplenomegaly. Bowel sounds positive.  Musculoskeletal: no clubbing / cyanosis. No joint deformity upper and lower extremities. Good ROM, no contractures. Normal muscle tone.  Skin: no rashes, lesions, ulcers. No induration Neurologic: CN 2-12 grossly intact. Sensation intact, DTR normal. Strength 5/5 in all 4.  Psychiatric: Known dementia. Normal mood.     Labs on Admission: I have personally reviewed following labs and imaging studies  CBC:  Recent Labs Lab 12/29/15 1037  WBC 7.2  HGB 14.1  HCT 48.1*  MCV 108.8*  PLT 183    Basic Metabolic Panel:  Recent Labs Lab 12/29/15 1037  NA 165*  K 3.9  CL 121*  CO2 31  GLUCOSE 122*  BUN 28*  CREATININE 1.27*  CALCIUM 10.1    GFR: CrCl cannot be calculated (Unknown ideal weight.).  Liver Function Tests:  Recent Labs Lab 12/29/15 1037  AST 32  ALT 21  ALKPHOS 70  BILITOT 0.7  PROT 8.1  ALBUMIN 3.9   No results for input(s): LIPASE, AMYLASE in the last 168 hours. No results for input(s): AMMONIA in the last 168 hours.  Coagulation Profile: No results for input(s): INR, PROTIME in the last 168 hours.  Cardiac Enzymes: No results for input(s): CKTOTAL, CKMB, CKMBINDEX, TROPONINI in the last 168 hours.  BNP (last 3 results) No results for input(s): PROBNP in the last 8760 hours.  HbA1C: No results for input(s): HGBA1C in the last 72 hours.  CBG:  Recent Labs Lab 12/29/15 1052  GLUCAP 109*    Lipid Profile: No results for input(s): CHOL, HDL, LDLCALC, TRIG, CHOLHDL, LDLDIRECT in the last 72 hours.  Thyroid Function Tests: No results for input(s): TSH, T4TOTAL, FREET4, T3FREE, THYROIDAB in the last 72 hours.  Anemia Panel: No results for input(s): VITAMINB12, FOLATE, FERRITIN, TIBC, IRON, RETICCTPCT in the last 72 hours.  Urine analysis:    Component Value Date/Time   COLORURINE YELLOW 12/29/2015 1229   APPEARANCEUR CLEAR  12/29/2015 1229   LABSPEC 1.025 12/29/2015 1229   PHURINE 6.0 12/29/2015 1229   GLUCOSEU NEGATIVE 12/29/2015 1229   HGBUR TRACE* 12/29/2015 1229   BILIRUBINUR NEGATIVE 12/29/2015 1229   KETONESUR NEGATIVE 12/29/2015 1229   PROTEINUR 30* 12/29/2015 1229   NITRITE NEGATIVE 12/29/2015 1229   LEUKOCYTESUR TRACE* 12/29/2015 1229    Sepsis Labs: @LABRCNTIP (procalcitonin:4,lacticidven:4) )No results found for this or any previous visit (from the past 240 hour(s)).   Radiological Exams on Admission: Ct Head Wo Contrast  12/29/2015  CLINICAL DATA:  Altered mental status.  Confusion, unsteady gait. EXAM: CT HEAD WITHOUT CONTRAST TECHNIQUE: Contiguous axial images were obtained from the base of the skull through the vertex without intravenous contrast. COMPARISON:  12/04/2015 FINDINGS: There is atrophy and chronic small vessel disease changes. No acute intracranial abnormality. Specifically, no hemorrhage, hydrocephalus, mass lesion, acute infarction, or significant intracranial injury. No acute calvarial abnormality. Visualized paranasal sinuses and mastoids clear. Orbital soft tissues unremarkable. IMPRESSION: No acute intracranial abnormality. Atrophy, chronic microvascular disease. Electronically Signed   By: 02/03/2016 M.D.   On: 12/29/2015 11:56    EKG: Independently reviewed.  Assessment/Plan Principal Problem:   Hypernatremia Active Problems:   AKI (acute kidney injury) (HCC)   Diabetes insipidus (HCC)   Dementia   Creatinine elevation    Hypernatremia in the setting of Diabetes Insipidus. Patient on Desmopressin. Current blood sugars 105.  - sodium 165  AKI - serum creatinine 1.27 on admission;   Received IVF at 125 cc/h  IVF at 75 cc/ h D5W Consult  To Renal  -Check BMET  q 4 hrs  Hypothyroidism: Chronic.  -Continue home Synthroid  Irritable bowel disease. No recent flares.  Continue ptrobiotics  Dementia, chronic, with acute, increased confusion due to DI Continue home  meds  Rheumathoid Arthritis  Continue home meds    DVT prophylaxis:  Heparin  Code Status:  Full Code Family Communication:  Discussed with daughter Disposition Plan: Expect patient to be discharged to Nursing Home Consults called:    Renal, Dr. 02/28/2016 Admission status: SDU    Lowell Guitar, PA-C [facility]   If 7PM-7AM, please contact night-coverage [email or web address] Password [email or web address]  12/29/2015, 2:11 PM

## 2015-12-29 NOTE — ED Notes (Signed)
Attempted report 

## 2015-12-29 NOTE — ED Provider Notes (Signed)
CSN: [id number]     Arrival date & time 12/29/15  1020 History   First MD Initiated Contact with Patient 12/29/15 1033     Chief Complaint  Patient presents with  . Altered Mental Status     (Consider location/radiation/quality/duration/timing/severity/associated sxs/prior Treatment) HPI Comments: LEVEL 5 CAVEAT FOR DEMENTIA 82 y/o F with hx of Diabetic insepidus, dementia, uti comes in with cc of Altered mental status. Per Ms. Joy at [facility], pt at baseline has severe dementia, but she now has been even more confused and has more unsteady gait. She also isnt sleeping as well. Pt also noted to be trying to disimpact herself, which was unusual. No known falls.   Patient is a 82 y.o. female presenting with altered mental status. The history is provided by the nursing home.  Altered Mental Status   Past Medical History  Diagnosis Date  . Osteoporosis   . Dry mouth     husband states she has issue with saliva glands and has to take medicaion fot hsi  . Forgetfulness   . Hypothyroidism   . Diabetes insipidus (HCC)   . Alzheimer disease   . GERD (gastroesophageal reflux disease)   . IBS (irritable bowel syndrome)   . Rheumatoid arthritis (HCC)   . Anemia   . Insomnia   . DDD (degenerative disc disease), lumbar    Past Surgical History  Procedure Laterality Date  . None    . Abdominal hysterectomy     Family History  Problem Relation Age of Onset  . Alzheimer's disease Mother   . Alzheimer's disease Brother    Social History  Substance Use Topics  . Smoking status: Never Smoker   . Smokeless tobacco: None  . Alcohol Use: No   OB History    No data available     Review of Systems  Unable to perform ROS: Dementia      Allergies  Keflex and Fosamax  Home Medications   Prior to Admission medications   Medication Sig Start Date End Date Taking? Authorizing Provider  acetaminophen (TYLENOL) 500 MG tablet Take 1,000 mg by mouth 2 (two) times daily as  needed for mild pain.    Historical Provider, MD  aspirin 81 MG chewable tablet Chew 81 mg by mouth daily before supper.    Historical Provider, MD  calamine lotion Apply 1 application topically daily as needed (rash).    Historical Provider, MD  desmopressin (DDAVP) 0.2 MG tablet Take 0.2 mg by mouth 2 (two) times daily.    Historical Provider, MD  divalproex (DEPAKOTE) 125 MG DR tablet Take 125 mg by mouth daily before supper.     Historical Provider, MD  donepezil (ARICEPT) 10 MG tablet Take 10 mg by mouth daily before supper.    Historical Provider, MD  ergocalciferol (VITAMIN D2) 50000 UNITS capsule Take 50,000 Units by mouth once a week. Thursdays    Historical Provider, MD  ferrous sulfate 325 (65 FE) MG tablet Take 325 mg by mouth daily with breakfast.    Historical Provider, MD  levofloxacin (LEVAQUIN) 750 MG tablet Take 1 tablet (750 mg total) by mouth every other day. 12/09/15   [name], MD  levothyroxine (SYNTHROID, LEVOTHROID) 75 MCG tablet Take 75 mcg by mouth daily before breakfast.    Historical Provider, MD  memantine (NAMENDA) 10 MG tablet Take 10 mg by mouth daily.    Historical Provider, MD  Multiple Vitamin (MULTIVITAMIN WITH MINERALS) TABS tablet Take 1 tablet by mouth  daily before supper.    Historical Provider, MD  pravastatin (PRAVACHOL) 20 MG tablet Take 20 mg by mouth daily before supper.     Historical Provider, MD  traZODone (DESYREL) 50 MG tablet Take 50 mg by mouth daily before supper.     Historical Provider, MD  Zinc Oxide (BALMEX EX) Apply 1 application topically 2 (two) times daily. Apply to right lower leg and eczema areas twice daily    Historical Provider, MD   BP 143/76 mmHg  Pulse 95  Temp(Src) 98.1 F (36.7 C) (Oral)  Resp 20  SpO2 94% Physical Exam  Constitutional: She appears well-developed.  HENT:  Head: Normocephalic and atraumatic.  Eyes: EOM are normal.  Neck: Normal range of motion. Neck supple.  Cardiovascular: Normal rate.    Pulmonary/Chest: Effort normal.  Abdominal: Bowel sounds are normal.  Neurological: She is alert.  Moving all 4 extremity  Skin: Skin is warm and dry.  Nursing note and vitals reviewed.   ED Course  .Critical Care Performed by: Derwood Kaplan Authorized by: Derwood Kaplan Total critical care time: 33 minutes Critical care time was exclusive of separately billable procedures and treating other patients. Critical care was necessary to treat or prevent imminent or life-threatening deterioration of the following conditions: metabolic crisis. Critical care was time spent personally by me on the following activities: blood draw for specimens, development of treatment plan with patient or surrogate, discussions with consultants, examination of patient, obtaining history from patient or surrogate, ordering and performing treatments and interventions, ordering and review of laboratory studies, pulse oximetry, ordering and review of radiographic studies, re-evaluation of patient's condition and review of old charts.   (including critical care time) Labs Review Labs Reviewed  COMPREHENSIVE METABOLIC PANEL - Abnormal; Notable for the following:    Sodium 165 (*)    Chloride 121 (*)    Glucose, Bld 122 (*)    BUN 28 (*)    Creatinine, Ser 1.27 (*)    GFR calc non Af Amer 38 (*)    GFR calc Af Amer 45 (*)    All other components within normal limits  CBC - Abnormal; Notable for the following:    HCT 48.1 (*)    MCV 108.8 (*)    MCHC 29.3 (*)    All other components within normal limits  CBG MONITORING, ED - Abnormal; Notable for the following:    Glucose-Capillary 109 (*)    All other components within normal limits  URINE CULTURE  URINALYSIS, ROUTINE W REFLEX MICROSCOPIC (NOT AT [facility])  OSMOLALITY, URINE  SODIUM, URINE, RANDOM  CREATININE, URINE, 24 HOUR  OSMOLALITY  VALPROIC ACID LEVEL    Imaging Review No results found. I have personally reviewed and evaluated these images and  lab results as part of my medical decision-making.   EKG Interpretation None      MDM   Final diagnoses:  Diabetes insipidus (HCC)  Acute hypernatremia    Pt comes in with AMS. She has hx of diabetes insepidus - and her Na returned at 165. Unsure what the cause is, seems like she was started on Belsomra recently, but DI is not a common side effect. With her unsteady gait - we will also get depakote level. Will need admission to step down due to her symptomatic hypernatremia - confusion, poor gait.  Free water defictis at 5.6 liters. For now, we will start her on NS 125 cc/hr infusion and let the medicine doctors take over the management. Will admit to  step down per recommendations for symptomatic Na > 160.     Derwood Kaplan, MD 12/29/15 1205

## 2015-12-29 NOTE — ED Notes (Signed)
Patients POA states okay for patient to not take Suvorexant tabs at night.

## 2015-12-29 NOTE — ED Notes (Signed)
Critical high serum osmolality of 353 per lab, MD notified.

## 2015-12-29 NOTE — Consult Note (Signed)
Christina Garcia Renal Consultation Note  Requesting MD: [facility] Indication for Consultation: Hypernatremia  HPI:  Christina Garcia is a 82 y.o. female. Alzheimer's ementia, Hypothyroidism, DI on desmopressin, IBS, RA, recent UTI with hospitalization on 4/10, brought from her nursing home due to increased confusion.  Per her niece Christina Garcia, she has baseline dementia and lives in [place]. Yesterday, she was noted to be flushed by the nursing home staff, with decreased PO intake. This AM had an elevated temp to 18F. She was also more confused than baseline when she awoke and had difficulty ambulating. This prompted her niece to take her to the ED.  In the ED she was noted to be hypernatremic to 165, Cr 1.27 disoriented.   [facility] was called and they confirmed decreased PO intake today and potentially earlier this week as she had increased behavior change noted on 5/3. She has been getting medications including desmopressin as scheduled   CREATININE, SER  Date/Time Value Ref Range Status  12/29/2015 10:37 AM 1.27* 0.44 - 1.00 mg/dL Final  12/06/2015 08:50 AM 0.71 0.44 - 1.00 mg/dL Final  12/05/2015 05:10 AM 0.59 0.44 - 1.00 mg/dL Final  12/04/2015 07:01 PM 0.70 0.44 - 1.00 mg/dL Final  11/23/2015 05:37 AM 0.78 0.44 - 1.00 mg/dL Final  11/22/2015 09:59 AM 1.12* 0.44 - 1.00 mg/dL Final  11/22/2015 06:05 AM 1.26* 0.44 - 1.00 mg/dL Final  11/22/2015 02:09 AM 1.33* 0.44 - 1.00 mg/dL Final  11/21/2015 09:57 PM 1.44* 0.44 - 1.00 mg/dL Final  11/21/2015 06:52 PM 1.62* 0.44 - 1.00 mg/dL Final  11/21/2015 03:33 PM 1.82* 0.44 - 1.00 mg/dL Final  11/21/2015 09:24 AM 2.57* 0.44 - 1.00 mg/dL Final  11/21/2015 01:58 AM 3.36* 0.44 - 1.00 mg/dL Final  11/20/2015 08:50 PM 3.41* 0.44 - 1.00 mg/dL Final  02/21/2015 05:06 PM 0.69 0.44 - 1.00 mg/dL Final     PMHx:   Past Medical History  Diagnosis Date  . Osteoporosis   . Dry mouth     husband states she has issue with  saliva glands and has to take medicaion fot hsi  . Forgetfulness   . Hypothyroidism   . Diabetes insipidus ([place])   . Alzheimer disease   . GERD (gastroesophageal reflux disease)   . IBS (irritable bowel syndrome)   . Rheumatoid arthritis ([place])   . Anemia   . Insomnia   . DDD (degenerative disc disease), lumbar     Past Surgical History  Procedure Laterality Date  . None    . Abdominal hysterectomy      Family Hx:  Family History  Problem Relation Age of Onset  . Alzheimer's disease Mother   . Alzheimer's disease Brother     Social History:  reports that she has never smoked. She does not have any smokeless tobacco history on file. She reports that she does not drink alcohol or use illicit drugs.  Allergies:  Allergies  Allergen Reactions  . Keflex [Cephalexin] Other (See Comments)    Transcribed from office notes faxed to short stay as "critical"  . Fosamax [Alendronate Sodium] Other (See Comments)    Transcribed from office notes as "critical"    Medications: Prior to Admission medications   Medication Sig Start Date End Date Taking? Authorizing Provider  acetaminophen (TYLENOL) 500 MG tablet Take 1,000 mg by mouth 2 (two) times daily as needed for mild pain.   Yes Historical Provider, MD  aspirin 81 MG chewable tablet Chew 81 mg by  mouth daily before supper.   Yes Historical Provider, MD  calamine lotion Apply 1 application topically daily as needed (rash).   Yes Historical Provider, MD  Cranberry 450 MG CAPS Take 450 mg by mouth 2 (two) times daily.   Yes Historical Provider, MD  desmopressin (DDAVP) 0.2 MG tablet Take 0.2 mg by mouth 2 (two) times daily.   Yes Historical Provider, MD  divalproex (DEPAKOTE ER) 250 MG 24 hr tablet Take 250 mg by mouth at bedtime.   Yes Historical Provider, MD  donepezil (ARICEPT) 10 MG tablet Take 10 mg by mouth daily before supper.   Yes Historical Provider, MD  ergocalciferol (VITAMIN D2) 50000 UNITS capsule Take 50,000 Units by  mouth once a week. Thursdays   Yes Historical Provider, MD  ferrous sulfate 325 (65 FE) MG tablet Take 325 mg by mouth daily with breakfast.   Yes Historical Provider, MD  levothyroxine (SYNTHROID, LEVOTHROID) 75 MCG tablet Take 75 mcg by mouth daily before breakfast.   Yes Historical Provider, MD  magnesium hydroxide (MILK OF MAGNESIA) 400 MG/5ML suspension Take 30 mLs by mouth at bedtime.   Yes Historical Provider, MD  Multiple Vitamin (MULTIVITAMIN WITH MINERALS) TABS tablet Take 1 tablet by mouth daily before supper.   Yes Historical Provider, MD  pravastatin (PRAVACHOL) 20 MG tablet Take 20 mg by mouth daily before supper.    Yes Historical Provider, MD  PRESCRIPTION MEDICATION Take 240 mLs by mouth 3 (three) times daily. Medpass   Yes Historical Provider, MD  Probiotic Product (PROBIOTIC PO) Take 1 tablet by mouth daily.   Yes Historical Provider, MD  sennosides-docusate sodium (SENOKOT-S) 8.6-50 MG tablet Take 1 tablet by mouth 2 (two) times daily.   Yes Historical Provider, MD  Suvorexant (BELSOMRA) 10 MG TABS Take 10 mg by mouth at bedtime.   Yes Historical Provider, MD  traZODone (DESYREL) 50 MG tablet Take 50 mg by mouth daily before supper.    Yes Historical Provider, MD  Zinc Oxide (BALMEX EX) Apply 1 application topically 2 (two) times daily. Apply to right lower leg and eczema areas twice daily   Yes Historical Provider, MD    I have reviewed the patient's current medications.  Labs:  Results for orders placed or performed during the hospital encounter of 12/29/15 (from the past 48 hour(s))  Comprehensive metabolic panel     Status: Abnormal   Collection Time: 12/29/15 10:37 AM  Result Value Ref Range   Sodium 165 (HH) 135 - 145 mmol/L    Comment: CRITICAL RESULT CALLED TO, READ BACK BY AND VERIFIED WITH: KEISHA REID,RN AT 1124 12/29/15 BY ZBEECH.    Potassium 3.9 3.5 - 5.1 mmol/L   Chloride 121 (H) 101 - 111 mmol/L   CO2 31 22 - 32 mmol/L   Glucose, Bld 122 (H) 65 - 99 mg/dL    BUN 28 (H) 6 - 20 mg/dL   Creatinine, Ser 1.27 (H) 0.44 - 1.00 mg/dL   Calcium 10.1 8.9 - 10.3 mg/dL   Total Protein 8.1 6.5 - 8.1 g/dL   Albumin 3.9 3.5 - 5.0 g/dL   AST 32 15 - 41 U/L   ALT 21 14 - 54 U/L   Alkaline Phosphatase 70 38 - 126 U/L   Total Bilirubin 0.7 0.3 - 1.2 mg/dL   GFR calc non Af Amer 38 (L) >60 mL/min   GFR calc Af Amer 45 (L) >60 mL/min    Comment: (NOTE) The eGFR has been calculated using the CKD EPI  equation. This calculation has not been validated in all clinical situations. eGFR's persistently <60 mL/min signify possible Chronic Kidney Disease.    Anion gap 13 5 - 15  CBC     Status: Abnormal   Collection Time: 12/29/15 10:37 AM  Result Value Ref Range   WBC 7.2 4.0 - 10.5 K/uL   RBC 4.42 3.87 - 5.11 MIL/uL   Hemoglobin 14.1 12.0 - 15.0 g/dL   HCT 48.1 (H) 36.0 - 46.0 %   MCV 108.8 (H) 78.0 - 100.0 fL   MCH 31.9 26.0 - 34.0 pg   MCHC 29.3 (L) 30.0 - 36.0 g/dL   RDW 15.5 11.5 - 15.5 %   Platelets 183 150 - 400 K/uL  CBG monitoring, ED     Status: Abnormal   Collection Time: 12/29/15 10:52 AM  Result Value Ref Range   Glucose-Capillary 109 (H) 65 - 99 mg/dL   Comment 1 Notify RN    Comment 2 Document in Chart   Urinalysis, Routine w reflex microscopic (not at [facility])     Status: Abnormal   Collection Time: 12/29/15 12:29 PM  Result Value Ref Range   Color, Urine YELLOW YELLOW   APPearance CLEAR CLEAR   Specific Gravity, Urine 1.025 1.005 - 1.030   pH 6.0 5.0 - 8.0   Glucose, UA NEGATIVE NEGATIVE mg/dL   Hgb urine dipstick TRACE (A) NEGATIVE   Bilirubin Urine NEGATIVE NEGATIVE   Ketones, ur NEGATIVE NEGATIVE mg/dL   Protein, ur 30 (A) NEGATIVE mg/dL   Nitrite NEGATIVE NEGATIVE   Leukocytes, UA TRACE (A) NEGATIVE  Osmolality, urine     Status: None   Collection Time: 12/29/15 12:29 PM  Result Value Ref Range   Osmolality, Ur 523 300 - 900 mOsm/kg  Sodium, urine, random     Status: None   Collection Time: 12/29/15 12:29 PM  Result Value  Ref Range   Sodium, Ur 30 mmol/L  Urine microscopic-add on     Status: Abnormal   Collection Time: 12/29/15 12:29 PM  Result Value Ref Range   Squamous Epithelial / LPF 0-5 (A) NONE SEEN   WBC, UA 0-5 0 - 5 WBC/hpf   RBC / HPF 0-5 0 - 5 RBC/hpf   Bacteria, UA RARE (A) NONE SEEN   Casts HYALINE CASTS (A) NEGATIVE   Urine-Other LESS THAN 10 mL OF URINE SUBMITTED     Comment: MICROSCOPIC EXAM PERFORMED ON UNCONCENTRATED URINE  Osmolality     Status: Abnormal   Collection Time: 12/29/15 12:43 PM  Result Value Ref Range   Osmolality 353 (HH) 275 - 295 mOsm/kg    Comment: REPEATED TO VERIFY CRITICAL RESULT CALLED TO, READ BACK BY AND VERIFIED WITH: T GOSS,RN 1331 12/29/15 D BRADLEY   Valproic acid level     Status: Abnormal   Collection Time: 12/29/15 12:43 PM  Result Value Ref Range   Valproic Acid Lvl 31 (L) 50.0 - 100.0 ug/mL     ROS:  Pertinent items are noted in HPI.  Physical Exam: Filed Vitals:   12/29/15 1431 12/29/15 1500  BP: 129/64 125/68  Pulse: 86 92  Temp:    Resp: 15 21     General: disoriented, uncooperative with exam, wrapped in blankets Heart: RRR, exam limited by patient compliance Lungs: Clear Abdomen: soft, non tender Extremities: no LE edema Skin: no rashes noted Neuro: disoriented, keep repeating " yes dear, I love you dear".  Psych: resistant to exam, resists attempts to remove blanket  Assessment/Plan: 1.Renal-  History of DI treated with desmopressin presenting with confusion and NA 165, Cr 1.27. UrineOsm 523, Serum Osm 353, hgb 14.1. Free water deficit 4.6 L. Hypernatremia likely occurred in setting of reduced PO intake, while still getting desmopressin. D5W 300 cc/hr, continue desmopressin 2. Hypertension/volume  - Stable blood pressure 4. Anemia  - Hgb 14.1, concentrated. Hgb 9.8 on 4/12   Haney,Alyssa 12/29/2015, 3:30 PM   Renal Attending: She has known DI admitted with altered mental status and severe hypernatremia in setting of decreased  PO intake.  She needs aggressive rehydration as outlined above due to the large fluid deficit. Aeric Burnham C

## 2015-12-29 NOTE — Progress Notes (Signed)
Pt arrived to [address] [place], VSS. Pt A&Ox1 pleasantly confused. Skin assessment completed with Levie Heritage, RN, no breakdown noted. Will continue to monitor.

## 2015-12-29 NOTE — Progress Notes (Signed)
CRITICAL VALUE ALERT  Critical value received:  NA 163  Date of notification: 12/29/15  Time of notification:1932  Critical value read back:Yes.    Nurse who received alert:  Balinda Quails, RN  MD notified (1st page):  Dr. Randol Kern  Time of first page:  1935  Orders to increase D5 to 115ml/hr.

## 2015-12-29 NOTE — ED Notes (Signed)
Pt transported to CT ?

## 2015-12-29 NOTE — ED Notes (Signed)
Patient CBG IS 109, Doctor was informed.

## 2015-12-29 NOTE — ED Notes (Signed)
Received pt from [facility] with c/o increased confusion, unsteady gait and restlessness. Symptoms have been ongoing since the 3rd. Pt has history of dementia. Pt has history of UTI last one was in April.

## 2015-12-29 NOTE — ED Notes (Signed)
Pt's niece and POA requesting that admitting call her with an update, when available.

## 2015-12-29 NOTE — ED Notes (Signed)
Spoke with MD about pt increased agitation and removal of IVs, to give 2mg  haldol IV for agitation. No new orders about hypernatremia.

## 2015-12-30 DIAGNOSIS — F0390 Unspecified dementia without behavioral disturbance: Secondary | ICD-10-CM | POA: Diagnosis not present

## 2015-12-30 DIAGNOSIS — E232 Diabetes insipidus: Secondary | ICD-10-CM | POA: Diagnosis not present

## 2015-12-30 DIAGNOSIS — E870 Hyperosmolality and hypernatremia: Secondary | ICD-10-CM | POA: Diagnosis not present

## 2015-12-30 DIAGNOSIS — F0391 Unspecified dementia with behavioral disturbance: Secondary | ICD-10-CM | POA: Diagnosis not present

## 2015-12-30 DIAGNOSIS — N179 Acute kidney failure, unspecified: Secondary | ICD-10-CM | POA: Diagnosis not present

## 2015-12-30 LAB — COMPREHENSIVE METABOLIC PANEL
ALBUMIN: 3.5 g/dL (ref 3.5–5.0)
ALT: 19 U/L (ref 14–54)
ANION GAP: 11 (ref 5–15)
AST: 30 U/L (ref 15–41)
Alkaline Phosphatase: 67 U/L (ref 38–126)
BILIRUBIN TOTAL: 0.9 mg/dL (ref 0.3–1.2)
BUN: 33 mg/dL — ABNORMAL HIGH (ref 6–20)
CHLORIDE: 112 mmol/L — AB (ref 101–111)
CO2: 32 mmol/L (ref 22–32)
Calcium: 9.3 mg/dL (ref 8.9–10.3)
Creatinine, Ser: 0.92 mg/dL (ref 0.44–1.00)
GFR calc Af Amer: >60 mL/min (ref >60)
GFR, EST NON AFRICAN AMERICAN: 57 mL/min — AB (ref >60)
GLUCOSE: 128 mg/dL — AB (ref 65–99)
POTASSIUM: 3.4 mmol/L — AB (ref 3.5–5.1)
Sodium: 155 mmol/L — ABNORMAL HIGH (ref 135–145)
TOTAL PROTEIN: 6.9 g/dL (ref 6.5–8.1)

## 2015-12-30 LAB — BASIC METABOLIC PANEL
ANION GAP: 12 (ref 5–15)
ANION GAP: 10 (ref 5–15)
ANION GAP: 10 (ref 5–15)
BUN: 28 mg/dL — ABNORMAL HIGH (ref 6–20)
BUN: 23 mg/dL — ABNORMAL HIGH (ref 6–20)
BUN: 22 mg/dL — ABNORMAL HIGH (ref 6–20)
CALCIUM: 9 mg/dL (ref 8.9–10.3)
CALCIUM: 8.9 mg/dL (ref 8.9–10.3)
CHLORIDE: 103 mmol/L (ref 101–111)
CO2: 31 mmol/L (ref 22–32)
CO2: 29 mmol/L (ref 22–32)
CO2: 26 mmol/L (ref 22–32)
CREATININE: 0.82 mg/dL (ref 0.44–1.00)
Calcium: 9.1 mg/dL (ref 8.9–10.3)
Chloride: 108 mmol/L (ref 101–111)
Chloride: 105 mmol/L (ref 101–111)
Creatinine, Ser: 0.84 mg/dL (ref 0.44–1.00)
Creatinine, Ser: 0.76 mg/dL (ref 0.44–1.00)
GFR calc Af Amer: >60 mL/min (ref >60)
GFR calc non Af Amer: >60 mL/min (ref >60)
GFR calc non Af Amer: >60 mL/min (ref >60)
GLUCOSE: 120 mg/dL — AB (ref 65–99)
Glucose, Bld: 103 mg/dL — ABNORMAL HIGH (ref 65–99)
Glucose, Bld: 110 mg/dL — ABNORMAL HIGH (ref 65–99)
POTASSIUM: 3.4 mmol/L — AB (ref 3.5–5.1)
Potassium: 3.2 mmol/L — ABNORMAL LOW (ref 3.5–5.1)
Potassium: 3.4 mmol/L — ABNORMAL LOW (ref 3.5–5.1)
SODIUM: 151 mmol/L — AB (ref 135–145)
SODIUM: 144 mmol/L (ref 135–145)
SODIUM: 139 mmol/L (ref 135–145)

## 2015-12-30 LAB — CBC
HEMATOCRIT: 39.3 % (ref 36.0–46.0)
HEMOGLOBIN: 11.7 g/dL — AB (ref 12.0–15.0)
MCH: 32.4 pg (ref 26.0–34.0)
MCHC: 29.8 g/dL — ABNORMAL LOW (ref 30.0–36.0)
MCV: 108.9 fL — AB (ref 78.0–100.0)
Platelets: 155 10*3/uL (ref 150–400)
RBC: 3.61 MIL/uL — ABNORMAL LOW (ref 3.87–5.11)
RDW: 15.5 % (ref 11.5–15.5)
WBC: 5 10*3/uL (ref 4.0–10.5)

## 2015-12-30 LAB — URINE CULTURE: Culture: NO GROWTH

## 2015-12-30 LAB — PROTIME-INR
INR: 1.2 (ref 0.00–1.49)
Prothrombin Time: 15.4 seconds — ABNORMAL HIGH (ref 11.6–15.2)

## 2015-12-30 LAB — MRSA PCR SCREENING: MRSA BY PCR: NEGATIVE

## 2015-12-30 LAB — GLUCOSE, CAPILLARY: Glucose-Capillary: 124 mg/dL — ABNORMAL HIGH (ref 65–99)

## 2015-12-30 MED ORDER — ENSURE ENLIVE PO LIQD
237.0000 mL | Freq: Two times a day (BID) | ORAL | Status: DC
  Administered 2015-12-30 – 2016-01-02 (×6): 237 mL via ORAL

## 2015-12-30 MED ORDER — POTASSIUM CHLORIDE CRYS ER 20 MEQ PO TBCR
40.0000 meq | EXTENDED_RELEASE_TABLET | Freq: Once | ORAL | Status: AC
  Administered 2015-12-30: 40 meq via ORAL
  Filled 2015-12-30: qty 2

## 2015-12-30 MED ORDER — HALOPERIDOL LACTATE 5 MG/ML IJ SOLN
2.0000 mg | Freq: Once | INTRAMUSCULAR | Status: AC
  Administered 2015-12-30: 2 mg via INTRAVENOUS
  Filled 2015-12-30: qty 1

## 2015-12-30 MED ORDER — ENOXAPARIN SODIUM 40 MG/0.4ML ~~LOC~~ SOLN
40.0000 mg | SUBCUTANEOUS | Status: DC
  Administered 2015-12-30 – 2016-01-02 (×4): 40 mg via SUBCUTANEOUS
  Filled 2015-12-30 (×4): qty 0.4

## 2015-12-30 NOTE — Progress Notes (Signed)
[facility] PROGRESS NOTE  Christina Garcia BJS:[id number] DOB: [birth year] DOA: 12/29/2015 PCP: Garlan Fillers, MD  Assessment/Plan Principal Problem:  Hypernatremia Active Problems:  AKI (acute kidney injury) (HCC)  Diabetes insipidus (HCC)  Dementia  Creatinine elevation   Hypernatremia in the setting of Diabetes Insipidus. Patient on Desmopressin.  Nephrology consult appreciated Will increase rate of D5 infusion now 200cc/hr Monitor blood glucose levels AKI resolved  Hypothyroidism: Chronic.  -Continue home Synthroid  Irritable bowel disease. No recent flares.  Continue ptrobiotics  Dementia, chronic, with acute, increased confusion due to DI Continue home meds.  Pleasantly consfused at this point.  Received haldol 2mg  IV x one yesterday.  Currently re-directable.  Rheumathoid Arthritis  Continue home meds   DVT prophylaxis: Lovenox Code Status: Full Code Family Communication: Patient alone this morning. Disposition Plan: Expect patient to be discharged to Nursing Home when ready Consults called: Renal, Dr. Admission status: SDU   Consultants:  Nephrology, Dr. Lowell Guitar  Procedures:  None  Antibiotics:  None  HPI/Subjective: Oriented to name only.  "Pleasantly confused".  No combative.  Objective: Filed Vitals:   12/30/15 0416 12/30/15 0737  BP: 106/88 130/78  Pulse: 81 79  Temp: 98.1 F (36.7 C) 98.7 F (37.1 C)  Resp: 23 17    Intake/Output Summary (Last 24 hours) at 12/30/15 0843 Last data filed at 12/30/15 0700  Gross per 24 hour  Intake 2525.83 ml  Output    400 ml  Net 2125.83 ml   Filed Weights   12/29/15 1829  Weight: 46.5 kg (102 lb 8.2 oz)    Exam:   General:  Oriented to name only, NAD  Cardiovascular: NR/RR  Respiratory: CTA bilaterally  Abdomen: soft and nontender  Musculoskeletal: moves all four extremities spontaneously  Data Reviewed: Basic Metabolic Panel:  Recent Labs Lab  12/29/15 1037 12/29/15 1907 12/29/15 2200 12/30/15 0207 12/30/15 0722  NA 165* 163* 159* 155* 151*  K 3.9 3.7 3.7 3.4* 3.4*  CL 121* 119* 117* 112* 108  CO2 31 32 30 32 31  GLUCOSE 122* 159* 159* 128* 103*  BUN 28* 33* 34* 33* 28*  CREATININE 1.27* 1.16* 1.01* 0.92 0.84  CALCIUM 10.1 9.9 9.6 9.3 9.1   Liver Function Tests:  Recent Labs Lab 12/29/15 1037 12/30/15 0207  AST 32 30  ALT 21 19  ALKPHOS 70 67  BILITOT 0.7 0.9  PROT 8.1 6.9  ALBUMIN 3.9 3.5   CBC:  Recent Labs Lab 12/29/15 1037 12/30/15 0207  WBC 7.2 5.0  HGB 14.1 11.7*  HCT 48.1* 39.3  MCV 108.8* 108.9*  PLT 183 155   CBG:  Recent Labs Lab 12/29/15 1052  GLUCAP 109*    Recent Results (from the past 240 hour(s))  MRSA PCR Screening     Status: None   Collection Time: 12/29/15  6:44 PM  Result Value Ref Range Status   MRSA by PCR NEGATIVE NEGATIVE Final    Comment:        The GeneXpert MRSA Assay (FDA approved for NASAL specimens only), is one component of a comprehensive MRSA colonization surveillance program. It is not intended to diagnose MRSA infection nor to guide or monitor treatment for MRSA infections.      Studies: Ct Head Wo Contrast  12/29/2015  CLINICAL DATA:  Altered mental status.  Confusion, unsteady gait. EXAM: CT HEAD WITHOUT CONTRAST TECHNIQUE: Contiguous axial images were obtained from the base of the skull through the vertex without intravenous contrast. COMPARISON:  12/04/2015 FINDINGS:  There is atrophy and chronic small vessel disease changes. No acute intracranial abnormality. Specifically, no hemorrhage, hydrocephalus, mass lesion, acute infarction, or significant intracranial injury. No acute calvarial abnormality. Visualized paranasal sinuses and mastoids clear. Orbital soft tissues unremarkable. IMPRESSION: No acute intracranial abnormality. Atrophy, chronic microvascular disease. Electronically Signed   By: Charlett Nose M.D.   On: 12/29/2015 11:56    Scheduled  Meds: . acidophilus  1 capsule Oral Daily  . aspirin  81 mg Oral QAC supper  . desmopressin  0.2 mg Oral BID  . divalproex  250 mg Oral QHS  . donepezil  10 mg Oral QAC supper  . [START ON 01/04/2016] ergocalciferol  50,000 Units Oral Weekly  . ferrous sulfate  325 mg Oral Q breakfast  . levothyroxine  75 mcg Oral QAC breakfast  . pravastatin  20 mg Oral QAC supper  . Suvorexant  10 mg Oral QHS   Continuous Infusions: . dextrose 150 mL/hr at 12/30/15 0700    Principal Problem:   Hypernatremia Active Problems:   AKI (acute kidney injury) (HCC)   Diabetes insipidus (HCC)   Dementia   Creatinine elevation   Acute hypernatremia    Time spent: 20 minutes    Jerene Bears  [facility] Pager [phone] If 7PM-7AM, please contact night-coverage at [email or web address], password [facility] 12/30/2015, 8:43 AM  LOS: 1 day

## 2015-12-30 NOTE — Progress Notes (Signed)
Assessment/Plan: Hypernatremia- History of DI treated with desmopressin presenting with confusion and NA 165, Cr 1.27. UrineOsm 523, Serum Osm 353, hgb 14.1. Free water deficit 4.6 L. Sodium improving and managed by TH. Improving. Plan: We will sign off.  Subjective: Interval History: Getting D5W  Objective: Vital signs in last 24 hours: Temp:  [97.6 F (36.4 C)-98.7 F (37.1 C)] 98.1 F (36.7 C) (05/06 1147) Pulse Rate:  [77-100] 88 (05/06 1147) Resp:  [12-23] 17 (05/06 1147) BP: (106-148)/(54-88) 130/69 mmHg (05/06 1147) SpO2:  [95 %-100 %] 100 % (05/06 1147) Weight:  [46.5 kg (102 lb 8.2 oz)] 46.5 kg (102 lb 8.2 oz) (05/05 1829) Weight change:   Intake/Output from previous day: 05/05 0701 - 05/06 0700 In: 2525.8 [P.O.:240; I.V.:2285.8] Out: 400 [Urine:400] Intake/Output this shift:    General appearance: alert and confused Head: Normocephalic, without obvious abnormality, atraumatic, facial plethora Resp: clear to auscultation bilaterally Chest wall: no tenderness Extremities: extremities normal, atraumatic, no cyanosis or edema  Lab Results:  Recent Labs  12/29/15 1037 12/30/15 0207  WBC 7.2 5.0  HGB 14.1 11.7*  HCT 48.1* 39.3  PLT 183 155   BMET:  Recent Labs  12/30/15 0207 12/30/15 0722  NA 155* 151*  K 3.4* 3.4*  CL 112* 108  CO2 32 31  GLUCOSE 128* 103*  BUN 33* 28*  CREATININE 0.92 0.84  CALCIUM 9.3 9.1   No results for input(s): PTH in the last 72 hours. Iron Studies: No results for input(s): IRON, TIBC, TRANSFERRIN, FERRITIN in the last 72 hours. Studies/Results: Ct Head Wo Contrast  12/29/2015  CLINICAL DATA:  Altered mental status.  Confusion, unsteady gait. EXAM: CT HEAD WITHOUT CONTRAST TECHNIQUE: Contiguous axial images were obtained from the base of the skull through the vertex without intravenous contrast. COMPARISON:  12/04/2015 FINDINGS: There is atrophy and chronic small vessel disease changes. No acute intracranial abnormality.  Specifically, no hemorrhage, hydrocephalus, mass lesion, acute infarction, or significant intracranial injury. No acute calvarial abnormality. Visualized paranasal sinuses and mastoids clear. Orbital soft tissues unremarkable. IMPRESSION: No acute intracranial abnormality. Atrophy, chronic microvascular disease. Electronically Signed   By: Charlett Nose M.D.   On: 12/29/2015 11:56    Scheduled: . acidophilus  1 capsule Oral Daily  . aspirin  81 mg Oral QAC supper  . desmopressin  0.2 mg Oral BID  . divalproex  250 mg Oral QHS  . donepezil  10 mg Oral QAC supper  . enoxaparin (LOVENOX) injection  40 mg Subcutaneous Q24H  . [START ON 01/04/2016] ergocalciferol  50,000 Units Oral Weekly  . ferrous sulfate  325 mg Oral Q breakfast  . levothyroxine  75 mcg Oral QAC breakfast  . pravastatin  20 mg Oral QAC supper  . Suvorexant  10 mg Oral QHS    LOS: 1 day   Marquite Attwood C 12/30/2015,12:46 PM

## 2015-12-31 DIAGNOSIS — F0390 Unspecified dementia without behavioral disturbance: Secondary | ICD-10-CM | POA: Diagnosis not present

## 2015-12-31 DIAGNOSIS — F0391 Unspecified dementia with behavioral disturbance: Secondary | ICD-10-CM

## 2015-12-31 DIAGNOSIS — E232 Diabetes insipidus: Secondary | ICD-10-CM | POA: Diagnosis not present

## 2015-12-31 DIAGNOSIS — N179 Acute kidney failure, unspecified: Secondary | ICD-10-CM | POA: Diagnosis not present

## 2015-12-31 DIAGNOSIS — E870 Hyperosmolality and hypernatremia: Secondary | ICD-10-CM | POA: Diagnosis not present

## 2015-12-31 LAB — BASIC METABOLIC PANEL
ANION GAP: 7 (ref 5–15)
BUN: 18 mg/dL (ref 6–20)
CHLORIDE: 99 mmol/L — AB (ref 101–111)
CO2: 25 mmol/L (ref 22–32)
Calcium: 8.4 mg/dL — ABNORMAL LOW (ref 8.9–10.3)
Creatinine, Ser: 0.65 mg/dL (ref 0.44–1.00)
GFR calc Af Amer: >60 mL/min (ref >60)
GLUCOSE: 104 mg/dL — AB (ref 65–99)
POTASSIUM: 3.8 mmol/L (ref 3.5–5.1)
Sodium: 131 mmol/L — ABNORMAL LOW (ref 135–145)

## 2015-12-31 LAB — GLUCOSE, CAPILLARY: GLUCOSE-CAPILLARY: 135 mg/dL — AB (ref 65–99)

## 2015-12-31 MED ORDER — WHITE PETROLATUM GEL
Status: AC
  Filled 2015-12-31: qty 1

## 2015-12-31 MED ORDER — SODIUM CHLORIDE 0.9 % IV SOLN
INTRAVENOUS | Status: DC
Start: 2015-12-31 — End: 2016-01-01
  Administered 2015-12-31: 10:00:00 via INTRAVENOUS

## 2015-12-31 MED ORDER — DONEPEZIL HCL 5 MG PO TABS
5.0000 mg | ORAL_TABLET | Freq: Every day | ORAL | Status: DC
  Administered 2015-12-31 – 2016-01-01 (×2): 5 mg via ORAL
  Filled 2015-12-31 (×2): qty 1

## 2015-12-31 MED ORDER — MEMANTINE HCL 10 MG PO TABS
10.0000 mg | ORAL_TABLET | Freq: Every day | ORAL | Status: DC
  Administered 2015-12-31 – 2016-01-02 (×3): 10 mg via ORAL
  Filled 2015-12-31 (×3): qty 1

## 2015-12-31 NOTE — Progress Notes (Signed)
[facility] PROGRESS NOTE  Christina Garcia [id number] DOB: March 14, 1933 DOA: 12/29/2015 PCP: Garlan Fillers, MD  Assessment/Plan Principal Problem:  Hypernatremia Active Problems:  AKI (acute kidney injury) (HCC)  Diabetes insipidus (HCC)  Dementia  Creatinine elevation   Hypernatremia in the setting of Diabetes Insipidus. Patient on Desmopressin.  Resolved.  Patient actually has mild hyponatremia this AM.  D5 infusion stopped.  Change to NS now.  Hypothyroidism: Chronic.  -Continue home Synthroid  Irritable bowel disease. No recent flares.  Continue ptrobiotics  Dementia with behavioral disturbance and insomnia --PRN haldol has worked well this admission --Discussed at bedside with patient's niece/POA.  She feels certain that behaviors changed when the PCP took her off of Namenda.  Will resume prior doses of namenda and aricept today.] --Behavioral Health Consult for additional recommendations; family in agreement  Rheumathoid Arthritis  Continue home meds   DVT prophylaxis: Lovenox Code Status: Full Code Family Communication: Spoke to niece/POA yesterday afternoon and this morning, at the bedside Disposition Plan: Expect patient to be discharged to Nursing Home when ready Consults called: Renal, Dr. Lowell Guitar (signed OFF) Admission status: SDU; ready for transfer to the floor with sitter  Consultants: Nephrology, Dr. Lowell Guitar (signed off)  Procedures:  None  Antibiotics:  None  HPI/Subjective: Oriented to name only.  Flat affect this morning.  Objective: Filed Vitals:   12/31/15 0746 12/31/15 1108  BP: 150/72 135/73  Pulse: 76 83  Temp: 98.1 F (36.7 C) 98.1 F (36.7 C)  Resp: 21 21    Intake/Output Summary (Last 24 hours) at 12/31/15 1252 Last data filed at 12/31/15 1100  Gross per 24 hour  Intake 4712.08 ml  Output      0 ml  Net 4712.08 ml   Filed Weights   12/29/15 1829  Weight: 46.5 kg (102 lb 8.2 oz)     Exam:   General:  Oriented to name only, NAD  Cardiovascular: NR/RR  Respiratory: CTA bilaterally  Abdomen: soft and nontender  Musculoskeletal: moves all four extremities spontaneously  Data Reviewed: Basic Metabolic Panel:  Recent Labs Lab 12/30/15 0207 12/30/15 0722 12/30/15 1235 12/30/15 1920 12/31/15 0346  NA 155* 151* 144 139 131*  K 3.4* 3.4* 3.2* 3.4* 3.8  CL 112* 108 105 103 99*  CO2 32 31 29 26 25   GLUCOSE 128* 103* 110* 120* 104*  BUN 33* 28* 23* 22* 18  CREATININE 0.92 0.84 0.76 0.82 0.65  CALCIUM 9.3 9.1 9.0 8.9 8.4*   Liver Function Tests:  Recent Labs Lab 12/29/15 1037 12/30/15 0207  AST 32 30  ALT 21 19  ALKPHOS 70 67  BILITOT 0.7 0.9  PROT 8.1 6.9  ALBUMIN 3.9 3.5   CBC:  Recent Labs Lab 12/29/15 1037 12/30/15 0207  WBC 7.2 5.0  HGB 14.1 11.7*  HCT 48.1* 39.3  MCV 108.8* 108.9*  PLT 183 155   CBG:  Recent Labs Lab 12/29/15 1052 12/30/15 2223  GLUCAP 109* 124*    Recent Results (from the past 240 hour(s))  Urine culture     Status: None   Collection Time: 12/29/15 12:29 PM  Result Value Ref Range Status   Specimen Description URINE, CATHETERIZED  Final   Special Requests NONE  Final   Culture NO GROWTH 1 DAY  Final   Report Status 12/30/2015 FINAL  Final  MRSA PCR Screening     Status: None   Collection Time: 12/29/15  6:44 PM  Result Value Ref Range Status   MRSA by PCR NEGATIVE  NEGATIVE Final    Comment:        The GeneXpert MRSA Assay (FDA approved for NASAL specimens only), is one component of a comprehensive MRSA colonization surveillance program. It is not intended to diagnose MRSA infection nor to guide or monitor treatment for MRSA infections.      Studies: No results found.  Scheduled Meds: . acidophilus  1 capsule Oral Daily  . aspirin  81 mg Oral QAC supper  . desmopressin  0.2 mg Oral BID  . divalproex  250 mg Oral QHS  . donepezil  5 mg Oral QAC supper  . enoxaparin (LOVENOX)  injection  40 mg Subcutaneous Q24H  . [START ON 01/04/2016] ergocalciferol  50,000 Units Oral Weekly  . feeding supplement (ENSURE ENLIVE)  237 mL Oral BID BM  . ferrous sulfate  325 mg Oral Q breakfast  . levothyroxine  75 mcg Oral QAC breakfast  . memantine  10 mg Oral Daily  . pravastatin  20 mg Oral QAC supper   Continuous Infusions: . sodium chloride 75 mL/hr at 12/31/15 4709    Principal Problem:   Hypernatremia Active Problems:   AKI (acute kidney injury) (HCC)   Diabetes insipidus (HCC)   Dementia   Creatinine elevation   Acute hypernatremia    Time spent: 20 minutes    Jerene Bears  [facility] Pager [phone] If 7PM-7AM, please contact night-coverage at [email or web address], password [facility] 12/31/2015, 12:52 PM  LOS: 2 days

## 2015-12-31 NOTE — Progress Notes (Signed)
Admission note:  Arrival Method: Patient transferred from 3S in bed accompanied by the staff. Mental Orientation: Alert to self (First name only). Telemetry: N/A Assessment: See doc flow sheets. Skin: No open areas or any wounds noted, no redness but some bruises noted on both arms.  Warm, dry and intact. IV: Right FA NS 75 ml/hr Pain: Faces 0. Tubes: N/A Safety Measures: Low bed with bed alarm on, call light and phone within reach which I am not sure she will be able to function.  She is in [employer] and near to nurses station.  She is on posey restraint and also on mittens. Fall Prevention Safety Plan: couldn't due to confusion. Admission Screening: In progress 6700 Orientation: Patient has been oriented to the unit, staff and to the room.

## 2016-01-01 DIAGNOSIS — E232 Diabetes insipidus: Secondary | ICD-10-CM | POA: Diagnosis not present

## 2016-01-01 DIAGNOSIS — R748 Abnormal levels of other serum enzymes: Secondary | ICD-10-CM | POA: Diagnosis not present

## 2016-01-01 DIAGNOSIS — E870 Hyperosmolality and hypernatremia: Secondary | ICD-10-CM | POA: Diagnosis not present

## 2016-01-01 DIAGNOSIS — F0390 Unspecified dementia without behavioral disturbance: Secondary | ICD-10-CM | POA: Diagnosis not present

## 2016-01-01 DIAGNOSIS — N179 Acute kidney failure, unspecified: Secondary | ICD-10-CM | POA: Diagnosis not present

## 2016-01-01 LAB — BASIC METABOLIC PANEL
Anion gap: 8 (ref 5–15)
BUN: 11 mg/dL (ref 6–20)
CO2: 23 mmol/L (ref 22–32)
CREATININE: 0.53 mg/dL (ref 0.44–1.00)
Calcium: 8.6 mg/dL — ABNORMAL LOW (ref 8.9–10.3)
Chloride: 101 mmol/L (ref 101–111)
Glucose, Bld: 93 mg/dL (ref 65–99)
POTASSIUM: 4.3 mmol/L (ref 3.5–5.1)
SODIUM: 132 mmol/L — AB (ref 135–145)

## 2016-01-01 MED ORDER — HYDROCODONE-ACETAMINOPHEN 5-325 MG PO TABS: 1.0000 | ORAL_TABLET | ORAL | Status: DC | PRN

## 2016-01-01 NOTE — Progress Notes (Signed)
Initial Nutrition Assessment  DOCUMENTATION CODES:   Not applicable  INTERVENTION:  Continue Ensure Enlive po BID, each supplement provides 350 kcal and 20 grams of protein.  Encourage adequate PO intake.   NUTRITION DIAGNOSIS:   Inadequate oral intake related to acute illness as evidenced by meal completion < 50%.  GOAL:   Patient will meet greater than or equal to 90% of their needs  MONITOR:   PO intake, Supplement acceptance, Weight trends, Labs, I & O's  REASON FOR ASSESSMENT:   Malnutrition Screening Tool    ASSESSMENT:   82 y.o. female with medical history significant for Dementia, Hypothyroidism, DI on desmopressin, IBS, RA, recent UTI with hospitalization on 4/10, brought from her nursing home due to increased confusion . She is to be admitted for further management of her hypernatremia.   Pt reports having a good appetite currently and PTA with usual consumption of at least 3 meals a day. Family at bedside reports pt additionally consumes Ensure Shakes. Pt with no significant weight loss. Meal completion today has been 25%. Pt currently has Ensure ordered and has been consuming them. RD to continue with current orders. Pt was encouraged to eat her food at meals and to drink her supplements.   Nutrition-Focused physical exam completed. Findings are no fat depletion, mild to moderate muscle depletion, and mild edema.   Labs and medications reviewed.   Diet Order:  Diet 2 gram sodium Room service appropriate?: Yes; Fluid consistency:: Thin  Skin:  Reviewed, no issues  Last BM:  5/8  Height:   Ht Readings from Last 1 Encounters:  12/29/15 4\' 11"  (1.499 m)    Weight:   Wt Readings from Last 1 Encounters:  01/01/16 112 lb 7 oz (51 kg)    Ideal Body Weight:  44.5 kg  BMI:  Body mass index is 22.7 kg/(m^2).  Estimated Nutritional Needs:   Kcal:  1300-1550  Protein:  60-70 grams  Fluid:  >/= 1.5 L/day  EDUCATION NEEDS:   No education needs  identified at this time  03/02/16, MS, RD, LDN Pager # [phone] After hours/ weekend pager # [phone]

## 2016-01-01 NOTE — Consult Note (Signed)
[facility] Consult   Reason for Consult:  Dementia with behaviors Referring Physician:  Dr. Candiss Norse Patient Identification: Christina Garcia MRN:  [id number] Principal Diagnosis: Dementia Diagnosis:   Patient Active Problem List   Diagnosis Date Noted  . Acute hypernatremia [E87.0] 12/29/2015  . Syncope [R55] 12/04/2015  . HCAP (healthcare-associated pneumonia) [J18.9] 12/04/2015  . UTI (urinary tract infection) [N39.0] 12/04/2015  . Creatinine elevation [R74.8]   . Hypernatremia [E87.0] 11/20/2015  . AKI (acute kidney injury) ([place]) [N17.9] 11/20/2015  . Diabetes insipidus ([place]) [E23.2] 11/20/2015  . Dementia [F03.90] 11/20/2015  . Hypothyroidism [E03.9] 11/20/2015    Total Time spent with patient: 1 hour  Subjective:   Christina Garcia is a 82 y.o. female patient admitted with Dementia withbehaviors or AMS.  HPI: Christina Garcia is a 82 y.o. female, seen, chart reviewed and case discussed with the staff RN for the face-to-face psychiatric evaluation and consultation of dementia with behavioral problems. Patient wit appeared sitting in a chair in the nursing station and folding towels. Patient seems to be very calm, pleasant but very poor historian. Patient does not recognize 3 objects show to her and she could do her first name but could not give her last name. Patient does not benefit from acute psychiatric hospitalization patient benefit from skilled nursing facility with the memory unit. Reportedly patient nephew came to the hospital and spend time with her this morning but he left before my visit to the patient  Medical history: Patient with medical history significant for Dementia, Hypothyroidism, DI on desmopressin, IBS, RA, recent UTI with hospitalization on 4/10, brought from her nursing home due to increased confusion . She was in her usual state of health until last afternoon, when she began to show increased lethargy, unsteady gait, decreased oral intake. Per  daughter's report, she was afebrile. And had not respiratory or cardiac complaints prior to admission. No lower extremity swelling. No apparent nausea, heartburn or change in bowel habits. No reported abdominal pain. She was not urinating, had to be reinstructed to do so, but she had difficulty following commands. No abnormal skin rashes or sick contacts.  Past Psychiatric History: Patient has no history of acute psychiatric hospitalization at behavioral [facility].  Risk to Self: Is patient at risk for suicide?: No Risk to Others:   Prior Inpatient Therapy:   Prior Outpatient Therapy:    Past Medical History:  Past Medical History  Diagnosis Date  . Osteoporosis   . Dry mouth     husband states she has issue with saliva glands and has to take medicaion fot hsi  . Forgetfulness   . Hypothyroidism   . Diabetes insipidus ([place])   . Alzheimer disease   . GERD (gastroesophageal reflux disease)   . IBS (irritable bowel syndrome)   . Rheumatoid arthritis ([place])   . Anemia   . Insomnia   . DDD (degenerative disc disease), lumbar     Past Surgical History  Procedure Laterality Date  . None    . Abdominal hysterectomy     Family History:  Family History  Problem Relation Age of Onset  . Alzheimer's disease Mother   . Alzheimer's disease Brother    Family Psychiatric  History: Unknown Social History:  History  Alcohol Use No     History  Drug Use No    Social History   Social History  . Marital Status: Married    Spouse Name: N/A  . Number of Children: N/A  .  Years of Education: N/A   Social History Main Topics  . Smoking status: Never Smoker   . Smokeless tobacco: None  . Alcohol Use: No  . Drug Use: No  . Sexual Activity: Not Asked   Other Topics Concern  . None   Social History Narrative   Additional Social History:    Allergies:   Allergies  Allergen Reactions  . Keflex [Cephalexin] Other (See Comments)    Transcribed from office notes faxed to  short stay as "critical"  . Fosamax [Alendronate Sodium] Other (See Comments)    Transcribed from office notes as "critical"    Labs:  Results for orders placed or performed during the hospital encounter of 12/29/15 (from the past 48 hour(s))  Basic metabolic panel     Status: Abnormal   Collection Time: 12/30/15 12:35 PM  Result Value Ref Range   Sodium 144 135 - 145 mmol/L   Potassium 3.2 (L) 3.5 - 5.1 mmol/L   Chloride 105 101 - 111 mmol/L   CO2 29 22 - 32 mmol/L   Glucose, Bld 110 (H) 65 - 99 mg/dL   BUN 23 (H) 6 - 20 mg/dL   Creatinine, Ser 0.76 0.44 - 1.00 mg/dL   Calcium 9.0 8.9 - 10.3 mg/dL   GFR calc non Af Amer >60 >60 mL/min   GFR calc Af Amer >60 >60 mL/min    Comment: (NOTE) The eGFR has been calculated using the CKD EPI equation. This calculation has not been validated in all clinical situations. eGFR's persistently <60 mL/min signify possible Chronic Kidney Disease.    Anion gap 10 5 - 15  Basic metabolic panel     Status: Abnormal   Collection Time: 12/30/15  7:20 PM  Result Value Ref Range   Sodium 139 135 - 145 mmol/L   Potassium 3.4 (L) 3.5 - 5.1 mmol/L   Chloride 103 101 - 111 mmol/L   CO2 26 22 - 32 mmol/L   Glucose, Bld 120 (H) 65 - 99 mg/dL   BUN 22 (H) 6 - 20 mg/dL   Creatinine, Ser 0.82 0.44 - 1.00 mg/dL   Calcium 8.9 8.9 - 10.3 mg/dL   GFR calc non Af Amer >60 >60 mL/min   GFR calc Af Amer >60 >60 mL/min    Comment: (NOTE) The eGFR has been calculated using the CKD EPI equation. This calculation has not been validated in all clinical situations. eGFR's persistently <60 mL/min signify possible Chronic Kidney Disease.    Anion gap 10 5 - 15  Glucose, capillary     Status: Abnormal   Collection Time: 12/30/15 10:23 PM  Result Value Ref Range   Glucose-Capillary 124 (H) 65 - 99 mg/dL  Basic metabolic panel     Status: Abnormal   Collection Time: 12/31/15  3:46 AM  Result Value Ref Range   Sodium 131 (L) 135 - 145 mmol/L    Comment: DELTA  CHECK NOTED   Potassium 3.8 3.5 - 5.1 mmol/L   Chloride 99 (L) 101 - 111 mmol/L   CO2 25 22 - 32 mmol/L   Glucose, Bld 104 (H) 65 - 99 mg/dL   BUN 18 6 - 20 mg/dL   Creatinine, Ser 0.65 0.44 - 1.00 mg/dL   Calcium 8.4 (L) 8.9 - 10.3 mg/dL   GFR calc non Af Amer >60 >60 mL/min   GFR calc Af Amer >60 >60 mL/min    Comment: (NOTE) The eGFR has been calculated using the CKD EPI equation. This calculation  has not been validated in all clinical situations. eGFR's persistently <60 mL/min signify possible Chronic Kidney Disease.    Anion gap 7 5 - 15  Glucose, capillary     Status: Abnormal   Collection Time: 12/31/15  9:04 PM  Result Value Ref Range   Glucose-Capillary 135 (H) 65 - 99 mg/dL  Basic metabolic panel     Status: Abnormal   Collection Time: 01/01/16  8:54 AM  Result Value Ref Range   Sodium 132 (L) 135 - 145 mmol/L   Potassium 4.3 3.5 - 5.1 mmol/L   Chloride 101 101 - 111 mmol/L   CO2 23 22 - 32 mmol/L   Glucose, Bld 93 65 - 99 mg/dL   BUN 11 6 - 20 mg/dL   Creatinine, Ser 0.53 0.44 - 1.00 mg/dL   Calcium 8.6 (L) 8.9 - 10.3 mg/dL   GFR calc non Af Amer >60 >60 mL/min   GFR calc Af Amer >60 >60 mL/min    Comment: (NOTE) The eGFR has been calculated using the CKD EPI equation. This calculation has not been validated in all clinical situations. eGFR's persistently <60 mL/min signify possible Chronic Kidney Disease.    Anion gap 8 5 - 15    Current Facility-Administered Medications  Medication Dose Route Frequency Provider Last Rate Last Dose  . acetaminophen (TYLENOL) tablet 650 mg  650 mg Oral Q6H PRN Rondel Jumbo, PA-C      . acidophilus (RISAQUAD) capsule 1 capsule  1 capsule Oral Daily Rondel Jumbo, PA-C   1 capsule at 01/01/16 8315  . aspirin chewable tablet 81 mg  81 mg Oral QAC supper Rondel Jumbo, PA-C   81 mg at 12/31/15 1621  . bisacodyl (DULCOLAX) suppository 10 mg  10 mg Rectal Daily PRN Rondel Jumbo, PA-C      . desmopressin (DDAVP) tablet 0.2  mg  0.2 mg Oral BID Rondel Jumbo, PA-C   0.2 mg at 01/01/16 1761  . divalproex (DEPAKOTE ER) 24 hr tablet 250 mg  250 mg Oral QHS Rondel Jumbo, PA-C   250 mg at 12/31/15 2235  . donepezil (ARICEPT) tablet 5 mg  5 mg Oral QAC supper Lily Kocher, MD   5 mg at 12/31/15 1621  . enoxaparin (LOVENOX) injection 40 mg  40 mg Subcutaneous Q24H Lily Kocher, MD   40 mg at 01/01/16 0905  . [START ON 01/04/2016] ergocalciferol (VITAMIN D2) capsule 50,000 Units  50,000 Units Oral Weekly Rondel Jumbo, PA-C      . feeding supplement (ENSURE ENLIVE) (ENSURE ENLIVE) liquid 237 mL  237 mL Oral BID BM Lily Kocher, MD   237 mL at 01/01/16 0906  . ferrous sulfate tablet 325 mg  325 mg Oral Q breakfast Rondel Jumbo, PA-C   325 mg at 01/01/16 6073  . HYDROcodone-acetaminophen (NORCO/VICODIN) 5-325 MG per tablet 1 tablet  1 tablet Oral Q4H PRN Thurnell Lose, MD      . levothyroxine (SYNTHROID, LEVOTHROID) tablet 75 mcg  75 mcg Oral QAC breakfast Rondel Jumbo, PA-C   75 mcg at 01/01/16 7106  . magnesium citrate solution 1 Bottle  1 Bottle Oral Once PRN Rondel Jumbo, PA-C      . memantine [facility]) tablet 10 mg  10 mg Oral Daily Lily Kocher, MD   10 mg at 01/01/16 0906  . ondansetron (ZOFRAN) injection 4 mg  4 mg Intravenous Q6H PRN Rondel Jumbo, PA-C      .  pravastatin (PRAVACHOL) tablet 20 mg  20 mg Oral QAC supper Rondel Jumbo, PA-C   20 mg at 12/31/15 1621  . senna-docusate (Senokot-S) tablet 1 tablet  1 tablet Oral QHS PRN Rondel Jumbo, PA-C        Musculoskeletal: Strength & Muscle Tone: within normal limits Gait & Station: unable to stand Patient leans: N/A  Psychiatric Specialty Exam: Review of Systems  Unable to perform ROS   Blood pressure 137/57, pulse 80, temperature 98.4 F (36.9 C), temperature source Oral, resp. rate 16, height 4' 11"  (1.499 m), weight 51 kg (112 lb 7 oz), SpO2 95 %.Body mass index is 22.7 kg/(m^2).  General Appearance: Casual  Eye Contact::  Good  Speech:   Clear and Coherent  Volume:  Normal  Mood:  Euthymic  Affect:  Appropriate and Congruent  Thought Process:  Irrelevant and Tangential  Orientation:  Negative except for her first name   Thought Content:  Patient has empty content and sweet talk without specifics throughout my evaluation  Suicidal Thoughts:  No  Homicidal Thoughts:  No  Memory:  Immediate;   Poor Recent;   Poor Remote;   Poor  Judgement:  Negative  Insight:  Lacking  Psychomotor Activity:  Normal  Concentration:  Poor  Recall:  Poor  Fund of Knowledge:Poor  Language: Fair  Akathisia:  Negative  Handed:  Right  AIMS (if indicated):     Assets:  Quarry manager  ADL's:  Impaired  Cognition: Impaired,  Severe  Sleep:      Treatment Plan Summary: Patient does not meet criteria for capacity to make her own medical decisions or living arrangements.  Patient appeared with his severe memory loss and a poor historian. Continue her current medication management Namenda 10 mg daily and Depakote ER 250 mg at bedtime along with her other medications for medical problems Recommended no additional psychiatric medications at this time Appreciate psychiatric consultation and we sign off as of today Please contact [phone] or [phone] if needs further assistance   Disposition: Patient benefit from skilled nursing facility with the memory unit when medically stable. Supportive therapy provided about ongoing stressors.  Durward Parcel., MD 01/01/2016 11:28 AM

## 2016-01-01 NOTE — Evaluation (Signed)
Physical Therapy Evaluation Patient Details Name: Christina Garcia MRN: [id number] DOB: 14-Oct-1932 Today's Date: 01/01/2016   History of Present Illness  Christina Garcia is a 82 y.o. female. Alzheimer's ementia, Hypothyroidism, DI on desmopressin, IBS, RA, recent UTI with hospitalization on 4/10, brought from her nursing home due to increased confusion.   Clinical Impression   Pt admitted with above diagnosis. Pt currently with functional limitations due to the deficits listed below (see PT Problem List).  Pt will benefit from skilled PT to increase their independence and safety with mobility to allow discharge to the venue listed below.       Follow Up Recommendations SNF (Worth considering PT eval once she returns to SNF)    Equipment Recommendations  None recommended by PT    Recommendations for Other Services       Precautions / Restrictions Precautions Precautions: Fall      Mobility  Bed Mobility Overal bed mobility: Needs Assistance Bed Mobility: Supine to Sit     Supine to sit: Mod assist     General bed mobility comments: Assist to initiate and elevate trunk into sitting due to cognitive deficits  Transfers Overall transfer level: Needs assistance Equipment used: 1 person hand held assist Transfers: Sit to/from Stand Sit to Stand: Min assist         General transfer comment: min handheld assist to steady; noted she braced her LEs for stability against bed  Ambulation/Gait Ambulation/Gait assistance: Min guard Ambulation Distance (Feet): 200 Feet Assistive device: 1 person hand held assist (and holding hallway rail) Gait Pattern/deviations: Step-through pattern;Decreased stride length;Drifts right/left     General Gait Details: Assist for safety; somewhat erratic step width  Stairs            Wheelchair Mobility    Modified Rankin (Stroke Patients Only)       Balance     Sitting balance-Leahy Scale: Good       Standing balance-Leahy  Scale: Fair                               Pertinent Vitals/Pain Pain Assessment: Faces Faces Pain Scale: No hurt    Home Living Family/patient expects to be discharged to:: Skilled nursing facility                      Prior Function Level of Independence: Needs assistance   Gait / Transfers Assistance Needed: very mobile at [facility]; walks hallways, holds hallway rails, no Rw           Hand Dominance        Extremity/Trunk Assessment   Upper Extremity Assessment: Overall WFL for tasks assessed (happily folding towels at end of session)           Lower Extremity Assessment: Overall WFL for tasks assessed      Cervical / Trunk Assessment: Kyphotic  Communication   Communication: No difficulties;Other (comment) (eith dementia; tends to repeat "I love you")  Cognition Arousal/Alertness: Awake/alert Behavior During Therapy: WFL for tasks assessed/performed Overall Cognitive Status: History of cognitive impairments - at baseline (at or very close to baseline)                      General Comments      Exercises        Assessment/Plan    PT Assessment Patient needs continued PT services  PT Diagnosis Abnormality of  gait   PT Problem List Decreased balance;Decreased mobility  PT Treatment Interventions Gait training;Functional mobility training;Therapeutic activities;Balance training;Patient/family education   PT Goals (Current goals can be found in the Care Plan section) Acute Rehab PT Goals Patient Stated Goal: Pt unable to state PT Goal Formulation: Patient unable to participate in goal setting Time For Goal Achievement: 01/15/16 Potential to Achieve Goals: Good    Frequency Min 3X/week   Barriers to discharge        Co-evaluation               End of Session Equipment Utilized During Treatment: Gait belt Activity Tolerance: Patient tolerated treatment well Patient left: in chair;with call bell/phone within  reach;with chair alarm set;with restraints reapplied;with family/visitor present Nurse Communication: Mobility status         Time: 7494-4967 PT Time Calculation (min) (ACUTE ONLY): 23 min   Charges:   PT Evaluation $PT Eval Moderate Complexity: 1 Procedure PT Treatments $Gait Training: 8-22 mins   PT G CodesOlen Pel 01/01/2016, 2:06 PM  Van Clines, [place]  Acute Rehabilitation Services Pager [phone] Office [phone]

## 2016-01-01 NOTE — Progress Notes (Signed)
PROGRESS NOTE                                                                                                                                                                                                             Patient Demographics:    Christina Garcia, is a 82 y.o. female, DOB - 1933/01/30, BWI:[id number]  Admit date - 12/29/2015   Admitting Physician Starleen Arms, MD  Outpatient Primary MD for the patient is Garlan Fillers, MD  LOS - 3  Outpatient Specialists:   Chief Complaint  Patient presents with  . Altered Mental Status       Brief Narrative     Christina Garcia is a 82 y.o. female with medical history significant for Dementia, Hypothyroidism, DI on desmopressin, IBS, RA, recent UTI with hospitalization on 4/10, brought from her nursing home due to increased confusion . She was in her usual state of health until last afternoon, when she began to show increased lethargy, unsteady gait, decreased oral intake. Per daughter's report, she was afebrile. And had not respiratory or cardiac complaints prior to admission. No lower extremity swelling. No apparent nausea, heartburn or change in bowel habits. No reported abdominal pain. She was not urinating, had to be reinstructed to do so, but she had difficulty following commands. No abnormal skin rashes or sick contacts.   ED Course: BP 127/73 mmHg  Pulse 87  Temp(Src) 98.1 F (36.7 C) (Oral)  Resp 16  SpO2 97% NA 165. Cr 1.27, other labs unremarkable. CXR normal. Started on IVF at 125 cc/h. She is to be admitted for further management of her hypernatremia. Renal has been called in consultation. She will be given D5W in the interim.    Subjective:    Christina Garcia today has, No headache, No chest pain, No abdominal pain - No Nausea, No new weakness tingling or numbness, No Cough - SOB.     Assessment  & Plan :     1.DI with hypernatremia. Placed back on  desmopressin, hydrated, sodium now actually slightly low, stop IV fluids, renal had seen the patient now signed off. We will repeat BMP in the morning if stable discharge.  2. Dementia. Currently on combination of Aricept, and amantadine and Depakote. There was some question of psych evaluation and [facility] placement, I doubt that is needed, we will  have psychiatry see the patient.  3. IBS. Stable.  4. Hypothyroidism. On Synthroid.  5. RA. No acute issues home medications continued.  6. Dyslipidemia. Continue statin.   Code Status : Full  Family Communication  : None present  Disposition Plan  : SNF likely in the am  Barriers For Discharge : Psych eval  Consults  :  Renal  Procedures  : CT head Non acute  DVT Prophylaxis  :  Lovenox   Lab Results  Component Value Date   PLT 155 12/30/2015    Antibiotics  :     Anti-infectives    None        Objective:   Filed Vitals:   12/31/15 1550 12/31/15 2233 01/01/16 0412 01/01/16 0827  BP: 128/67 129/75 113/47 137/57  Pulse: 80 78 74 80  Temp:  97.7 F (36.5 C) 97.8 F (36.6 C) 98.4 F (36.9 C)  TempSrc:  Oral Oral Oral  Resp: 16 18 16 16   Height:      Weight:   51 kg (112 lb 7 oz)   SpO2: 98% 98% 97% 95%    Wt Readings from Last 3 Encounters:  01/01/16 51 kg (112 lb 7 oz)  12/04/15 52.118 kg (114 lb 14.4 oz)  11/21/15 46.04 kg (101 lb 8 oz)     Intake/Output Summary (Last 24 hours) at 01/01/16 1148 Last data filed at 01/01/16 1015  Gross per 24 hour  Intake   1805 ml  Output      0 ml  Net   1805 ml     Physical Exam  Awake , pleasantly confused, No new F.N deficits, Normal affect [place].AT,PERRAL Supple Neck,No JVD, No cervical lymphadenopathy appriciated.  Symmetrical Chest wall movement, Good air movement bilaterally, CTAB RRR,No Gallops,Rubs or new Murmurs, No Parasternal Heave +ve B.Sounds, Abd Soft, No tenderness, No organomegaly appriciated, No rebound - guarding or rigidity. No Cyanosis, Clubbing  or edema, No new Rash or bruise       Data Review:    CBC  Recent Labs Lab 12/29/15 1037 12/30/15 0207  WBC 7.2 5.0  HGB 14.1 11.7*  HCT 48.1* 39.3  PLT 183 155  MCV 108.8* 108.9*  MCH 31.9 32.4  MCHC 29.3* 29.8*  RDW 15.5 15.5    Chemistries   Recent Labs Lab 12/29/15 1037  12/30/15 0207 12/30/15 0722 12/30/15 1235 12/30/15 1920 12/31/15 0346 01/01/16 0854  NA 165*  < > 155* 151* 144 139 131* 132*  K 3.9  < > 3.4* 3.4* 3.2* 3.4* 3.8 4.3  CL 121*  < > 112* 108 105 103 99* 101  CO2 31  < > 32 31 29 26 25 23   GLUCOSE 122*  < > 128* 103* 110* 120* 104* 93  BUN 28*  < > 33* 28* 23* 22* 18 11  CREATININE 1.27*  < > 0.92 0.84 0.76 0.82 0.65 0.53  CALCIUM 10.1  < > 9.3 9.1 9.0 8.9 8.4* 8.6*  AST 32  --  30  --   --   --   --   --   ALT 21  --  19  --   --   --   --   --   ALKPHOS 70  --  67  --   --   --   --   --   BILITOT 0.7  --  0.9  --   --   --   --   --   < > =  values in this interval not displayed. ------------------------------------------------------------------------------------------------------------------ No results for input(s): CHOL, HDL, LDLCALC, TRIG, CHOLHDL, LDLDIRECT in the last 72 hours.  Lab Results  Component Value Date   HGBA1C 5.1 11/21/2015   ------------------------------------------------------------------------------------------------------------------ No results for input(s): TSH, T4TOTAL, T3FREE, THYROIDAB in the last 72 hours.  Invalid input(s): FREET3 ------------------------------------------------------------------------------------------------------------------ No results for input(s): VITAMINB12, FOLATE, FERRITIN, TIBC, IRON, RETICCTPCT in the last 72 hours.  Coagulation profile  Recent Labs Lab 12/30/15 0207  INR 1.20    No results for input(s): DDIMER in the last 72 hours.  Cardiac Enzymes No results for input(s): CKMB, TROPONINI, MYOGLOBIN in the last 168 hours.  Invalid input(s):  CK ------------------------------------------------------------------------------------------------------------------    Component Value Date/Time   BNP 43.6 02/21/2015 1842    Inpatient Medications  Scheduled Meds: . acidophilus  1 capsule Oral Daily  . aspirin  81 mg Oral QAC supper  . desmopressin  0.2 mg Oral BID  . divalproex  250 mg Oral QHS  . donepezil  5 mg Oral QAC supper  . enoxaparin (LOVENOX) injection  40 mg Subcutaneous Q24H  . [START ON 01/04/2016] ergocalciferol  50,000 Units Oral Weekly  . feeding supplement (ENSURE ENLIVE)  237 mL Oral BID BM  . ferrous sulfate  325 mg Oral Q breakfast  . levothyroxine  75 mcg Oral QAC breakfast  . memantine  10 mg Oral Daily  . pravastatin  20 mg Oral QAC supper   Continuous Infusions:  PRN Meds:.acetaminophen **OR** [DISCONTINUED] acetaminophen, bisacodyl, magnesium citrate, [DISCONTINUED] ondansetron **OR** ondansetron (ZOFRAN) IV, senna-docusate  Micro Results Recent Results (from the past 240 hour(s))  Urine culture     Status: None   Collection Time: 12/29/15 12:29 PM  Result Value Ref Range Status   Specimen Description URINE, CATHETERIZED  Final   Special Requests NONE  Final   Culture NO GROWTH 1 DAY  Final   Report Status 12/30/2015 FINAL  Final  MRSA PCR Screening     Status: None   Collection Time: 12/29/15  6:44 PM  Result Value Ref Range Status   MRSA by PCR NEGATIVE NEGATIVE Final    Comment:        The GeneXpert MRSA Assay (FDA approved for NASAL specimens only), is one component of a comprehensive MRSA colonization surveillance program. It is not intended to diagnose MRSA infection nor to guide or monitor treatment for MRSA infections.     Radiology Reports   Ct Head Wo Contrast  12/29/2015  CLINICAL DATA:  Altered mental status.  Confusion, unsteady gait. EXAM: CT HEAD WITHOUT CONTRAST TECHNIQUE: Contiguous axial images were obtained from the base of the skull through the vertex without  intravenous contrast. COMPARISON:  12/04/2015 FINDINGS: There is atrophy and chronic small vessel disease changes. No acute intracranial abnormality. Specifically, no hemorrhage, hydrocephalus, mass lesion, acute infarction, or significant intracranial injury. No acute calvarial abnormality. Visualized paranasal sinuses and mastoids clear. Orbital soft tissues unremarkable. IMPRESSION: No acute intracranial abnormality. Atrophy, chronic microvascular disease. Electronically Signed   By: Charlett Nose M.D.   On: 12/29/2015 11:56    Time Spent in minutes  30   Davita Sublett K M.D on 01/01/2016 at 11:48 AM  Between 7am to 7pm - Pager - [phone]  After 7pm go to [email or web address] - password [facility]  [facility] -  Office  [phone]

## 2016-01-02 DIAGNOSIS — E870 Hyperosmolality and hypernatremia: Secondary | ICD-10-CM | POA: Diagnosis not present

## 2016-01-02 DIAGNOSIS — F0390 Unspecified dementia without behavioral disturbance: Secondary | ICD-10-CM | POA: Diagnosis not present

## 2016-01-02 DIAGNOSIS — N179 Acute kidney failure, unspecified: Secondary | ICD-10-CM | POA: Diagnosis not present

## 2016-01-02 DIAGNOSIS — E232 Diabetes insipidus: Secondary | ICD-10-CM | POA: Diagnosis not present

## 2016-01-02 DIAGNOSIS — R748 Abnormal levels of other serum enzymes: Secondary | ICD-10-CM | POA: Diagnosis not present

## 2016-01-02 LAB — BASIC METABOLIC PANEL
ANION GAP: 8 (ref 5–15)
BUN: 12 mg/dL (ref 6–20)
CHLORIDE: 101 mmol/L (ref 101–111)
CO2: 24 mmol/L (ref 22–32)
Calcium: 9.2 mg/dL (ref 8.9–10.3)
Creatinine, Ser: 0.61 mg/dL (ref 0.44–1.00)
GFR calc Af Amer: >60 mL/min (ref >60)
GFR calc non Af Amer: >60 mL/min (ref >60)
GLUCOSE: 89 mg/dL (ref 65–99)
POTASSIUM: 4.1 mmol/L (ref 3.5–5.1)
Sodium: 133 mmol/L — ABNORMAL LOW (ref 135–145)

## 2016-01-02 MED ORDER — MEMANTINE HCL 10 MG PO TABS: 10.0000 mg | ORAL_TABLET | Freq: Every day | ORAL | Status: AC

## 2016-01-02 MED ORDER — DONEPEZIL HCL 5 MG PO TABS: 5.0000 mg | ORAL_TABLET | Freq: Every day | ORAL | Status: AC

## 2016-01-02 MED ORDER — DOCUSATE SODIUM 100 MG PO CAPS: 100.0000 mg | ORAL_CAPSULE | Freq: Two times a day (BID) | ORAL | Status: AC | PRN

## 2016-01-02 NOTE — Progress Notes (Signed)
Patient to be discharge to [facility]. IV removed. Patient transported to car via wheelchair. Called and report given. All questions answered. Son at bedside to transport.

## 2016-01-02 NOTE — NC FL2 (Signed)
[place] MEDICAID FL2 LEVEL OF CARE SCREENING TOOL     IDENTIFICATION  Patient Name: Christina Garcia Birthdate: 16-Sep-1932 Sex: female Admission Date (Current Location): 12/29/2015  [facility] and [place] Number:  Producer, television/film/video and Address:  [facility], [address], [place], [place] [address]      Provider Number: [id number]  Attending Physician Name and Address:  Leroy Sea, MD  Relative Name and Phone Number:  [name] - nephew [facility]) [phone]    Current Level of Care: Hospital Recommended Level of Care: Assisted Living Facility ([facility], [place]) Prior Approval Number:    Date Approved/Denied:   PASRR Number:    Discharge Plan: Other (Comment) ([facility])    Current Diagnoses: Patient Active Problem List   Diagnosis Date Noted  . Acute hypernatremia 12/29/2015  . Syncope 12/04/2015  . HCAP (healthcare-associated pneumonia) 12/04/2015  . UTI (urinary tract infection) 12/04/2015  . Creatinine elevation   . Hypernatremia 11/20/2015  . AKI (acute kidney injury) (HCC) 11/20/2015  . Diabetes insipidus (HCC) 11/20/2015  . Dementia 11/20/2015  . Hypothyroidism 11/20/2015    Orientation RESPIRATION BLADDER Height & Weight     Self  Normal Incontinent Weight: 112 lb 7 oz (51 kg) Height:  4\' 11"  (149.9 cm)  BEHAVIORAL SYMPTOMS/MOOD NEUROLOGICAL BOWEL NUTRITION STATUS      Incontinent Diet (Heart healthy)  AMBULATORY STATUS COMMUNICATION OF NEEDS Skin   Limited Assist (Patient walked 200 ft. with 1-person hand-held assist on 5/8.) Verbally Other (Comment) (Moisture associated skin damage to medial buttocks)                       Personal Care Assistance Level of Assistance  Bathing, Feeding, Dressing Bathing Assistance: Limited assistance Feeding assistance: Independent Dressing Assistance: Limited assistance     Functional Limitations Info  Sight, Hearing, Speech Sight Info:  Impaired (glassess lost per nephew) Hearing Info: Adequate Speech Info: Adequate    SPECIAL CARE FACTORS FREQUENCY  PT (By licensed PT)     PT Frequency: Evaluated 5/8 and a minimum of 3X per week recommended              Contractures Contractures Info: Not present    Additional Factors Info  Code Status, Allergies Code Status Info: Full Code Allergies Info: Keflex, Fosamax           Current Medications (01/02/2016):  This is the current hospital active medication list Current Facility-Administered Medications  Medication Dose Route Frequency Provider Last Rate Last Dose  . acetaminophen (TYLENOL) tablet 650 mg  650 mg Oral Q6H PRN 03/03/2016, PA-C      . acidophilus (RISAQUAD) capsule 1 capsule  1 capsule Oral Daily Marcos Eke, PA-C   1 capsule at 01/02/16 03/03/16  . aspirin chewable tablet 81 mg  81 mg Oral QAC supper 6387, PA-C   81 mg at 01/01/16 1735  . bisacodyl (DULCOLAX) suppository 10 mg  10 mg Rectal Daily PRN 03/02/16, PA-C      . desmopressin (DDAVP) tablet 0.2 mg  0.2 mg Oral BID Marcos Eke, PA-C   0.2 mg at 01/02/16 03/03/16  . divalproex (DEPAKOTE ER) 24 hr tablet 250 mg  250 mg Oral QHS 5643, PA-C   250 mg at 01/01/16 2147  . donepezil (ARICEPT) tablet 5 mg  5 mg Oral QAC supper 2148, MD   5 mg at 01/01/16  1735  . enoxaparin (LOVENOX) injection 40 mg  40 mg Subcutaneous Q24H Michael Litter, MD   40 mg at 01/02/16 0937  . [START ON 01/04/2016] ergocalciferol (VITAMIN D2) capsule 50,000 Units  50,000 Units Oral Weekly Marcos Eke, PA-C      . feeding supplement (ENSURE ENLIVE) (ENSURE ENLIVE) liquid 237 mL  237 mL Oral BID BM Michael Litter, MD   237 mL at 01/02/16 0939  . ferrous sulfate tablet 325 mg  325 mg Oral Q breakfast Marcos Eke, PA-C   325 mg at 01/02/16 0818  . levothyroxine (SYNTHROID, LEVOTHROID) tablet 75 mcg  75 mcg Oral QAC breakfast Marcos Eke, PA-C   75 mcg at 01/02/16 9012  . magnesium citrate  solution 1 Bottle  1 Bottle Oral Once PRN Marcos Eke, PA-C      . memantine [facility]) tablet 10 mg  10 mg Oral Daily Michael Litter, MD   10 mg at 01/02/16 [phone]  . ondansetron (ZOFRAN) injection 4 mg  4 mg Intravenous Q6H PRN Marcos Eke, PA-C      . pravastatin (PRAVACHOL) tablet 20 mg  20 mg Oral QAC supper Marcos Eke, PA-C   20 mg at 01/01/16 1735  . senna-docusate (Senokot-S) tablet 1 tablet  1 tablet Oral QHS PRN Marcos Eke, PA-C         Discharge Medications: Please see discharge summary for a list of discharge medications.  Relevant Imaging Results:  Relevant Lab Results:   Additional Information DISCHARGE MEDICATIONS: acetaminophen 500 MG tablet - Take 1,000 mg by mouth 2 (two) times daily as needed for mild pain. aspirin 81 mg daily before supper. Balmex EX-1 application topically 2 times daily. Belsomra 10 MG tabs-take 10 mg at bedtime. Calamine lotion-1 application topically prn for rash. Cranberry 450 MG Caps-take 450 2 times dialy. Depakote ER 250 MG 24 hr tablet-take 250 mg at bedtime. Desmopressin 0.2 MG tablet-Take 0.2-two times daily. Docusate sodium 100 MG capsule-Take 1 capsule two times daily prn for mild constipation. Donepezil 5 MG tablet-Take 1 tablet daily before supper. Ergocalciferol 50000 units capsule-Take 50,000 units once a week on Thursdays.  Ferrous sulfate  325 (65 FE) MG tablet-Take 325 mg daily with breakfast. Levothyroxine 75 MCG tablet-Take 75 mcg daly before breakfast. Magnesium hydroxide 400 MG/5ML suspension-take 30 mLs at bedtime. Memantine 10 MG tablet-take 1 tablet daily.  Multivitamin with minerals Tabs tablet-take 1 tablet daily before supper.  Pravastatin 20 MG tablet-take 20 mg daily before supper. Probiotic PO-take 1 tablet daily. Sennosides-docusate sodium 8.6-50 MG tablet-take 1 tablet two times daily.  Cristobal Goldmann, LCSW

## 2016-01-02 NOTE — Clinical Social Work Note (Signed)
Patient has discharged back to [facility] today, transported by her nephew, [name]. Discharge information transmitted to facility, reviewed and approved.  Genelle Bal, MSW, LCSW Licensed Clinical Social Worker Clinical Social Work Department [employer] [phone]

## 2016-01-02 NOTE — Discharge Summary (Addendum)
Christina Garcia, is a 82 y.o. female  DOB Feb 17, 1933  MRN [id number].  Admission date:  12/29/2015  Admitting Physician  Starleen Arms, MD  Discharge Date:  01/02/2016   Primary MD  Garlan Fillers, MD  Recommendations for primary care physician for things to follow:   Check CBC, BMP in 3-4 days   Outpatient nephrology follow-up if needed   Admission Diagnosis  Diabetes insipidus (HCC) [E23.2] Acute hypernatremia [E87.0]   Discharge Diagnosis  Diabetes insipidus (HCC) [E23.2] Acute hypernatremia [E87.0]     Principal Problem:   Dementia Active Problems:   Hypernatremia   AKI (acute kidney injury) (HCC)   Diabetes insipidus (HCC)   Creatinine elevation   Acute hypernatremia      Past Medical History  Diagnosis Date  . Osteoporosis   . Dry mouth     husband states she has issue with saliva glands and has to take medicaion fot hsi  . Forgetfulness   . Hypothyroidism   . Diabetes insipidus (HCC)   . Alzheimer disease   . GERD (gastroesophageal reflux disease)   . IBS (irritable bowel syndrome)   . Rheumatoid arthritis (HCC)   . Anemia   . Insomnia   . DDD (degenerative disc disease), lumbar     Past Surgical History  Procedure Laterality Date  . None    . Abdominal hysterectomy         HPI  from the history and physical done on the day of admission:    Christina Garcia is a 82 y.o. female with medical history significant for Dementia, Hypothyroidism, DI on desmopressin, IBS, RA, recent UTI with hospitalization on 4/10, brought from her nursing home due to increased confusion . She was in her usual state of health until last afternoon, when she began to show increased lethargy, unsteady gait, decreased oral intake. Per daughter's report, she was afebrile. And had not respiratory or cardiac  complaints prior to admission. No lower extremity swelling. No apparent nausea, heartburn or change in bowel habits. No reported abdominal pain. She was not urinating, had to be reinstructed to do so, but she had difficulty following commands. No abnormal skin rashes or sick contacts.   ED Course: BP 127/73 mmHg  Pulse 87  Temp(Src) 98.1 F (36.7 C) (Oral)  Resp 16  SpO2 97% NA 165. Cr 1.27, other labs unremarkable. CXR normal. Started on IVF at 125 cc/h. She is to be admitted for further management of her hypernatremia. Renal has been called in consultation. She will be given D5W in the interim.     Hospital Course:     1.DI with hypernatremia. Placed back on desmopressin, hydrated, sodium now actually slightly low, stopped IV fluids, renal had seen the patient now signed off. Stable BMP will DC to ALF per S Work,  with close BMP monitor, will continue present dose desmopressin.  2. Severe Dementia. Currently on combination of Aricept, and amantadine and Depakote. There was some question of psych evaluation and [facility] placement, psych saw the  patient and cleared her for SNF discharge. I concurred.  3. IBS. Stable supportive care.  4. Hypothyroidism. On Synthroid.  5. RA. No acute issues home medications continued.  6. Dyslipidemia. Continue statin.    Follow UP  Follow-up Information    Follow up with Garlan Fillers, MD. Schedule an appointment as soon as possible for a visit in 1 week.   Specialty:  Internal Medicine   Contact information:   [address] [place] [address] [phone]        Consults obtained - Renal, Psych  Discharge Condition: Stable  Diet and Activity recommendation: See Discharge Instructions below  Discharge Instructions       Discharge Instructions    Discharge instructions    Complete by:  As directed   Follow with Primary MD Garlan Fillers, MD in 7 days   Get CBC, CMP, 2 view Chest X ray checked  by SNF MD in 3-4 days.     Activity: As tolerated with Full fall precautions use walker/cane & assistance as needed   Disposition ALF   Diet:   Heart Healthy  with feeding assistance and aspiration precautions.  For Heart failure patients - Check your Weight same time everyday, if you gain over 2 pounds, or you develop in leg swelling, experience more shortness of breath or chest pain, call your Primary MD immediately. Follow Cardiac Low Salt Diet and 1.5 lit/day fluid restriction.   On your next visit with your primary care physician please Get Medicines reviewed and adjusted.   Please request your Prim.MD to go over all Hospital Tests and Procedure/Radiological results at the follow up, please get all Hospital records sent to your Prim MD by signing hospital release before you go home.   If you experience worsening of your admission symptoms, develop shortness of breath, life threatening emergency, suicidal or homicidal thoughts you must seek medical attention immediately by calling 911 or calling your MD immediately  if symptoms less severe.  You Must read complete instructions/literature along with all the possible adverse reactions/side effects for all the Medicines you take and that have been prescribed to you. Take any new Medicines after you have completely understood and accpet all the possible adverse reactions/side effects.   Do not drive, operating heavy machinery, perform activities at heights, swimming or participation in water activities or provide baby sitting services if your were admitted for syncope or siezures until you have seen by Primary MD or a Neurologist and advised to do so again.  Do not drive when taking Pain medications.    Do not take more than prescribed Pain, Sleep and Anxiety Medications  Special Instructions: If you have smoked or chewed Tobacco  in the last 2 yrs please stop smoking, stop any regular Alcohol  and or any Recreational drug use.  Wear Seat belts while  driving.   Please note  You were cared for by a hospitalist during your hospital stay. If you have any questions about your discharge medications or the care you received while you were in the hospital after you are discharged, you can call the unit and asked to speak with the hospitalist on call if the hospitalist that took care of you is not available. Once you are discharged, your primary care physician will handle any further medical issues. Please note that NO REFILLS for any discharge medications will be authorized once you are discharged, as it is imperative that you return to your primary care physician (or establish a relationship with  a primary care physician if you do not have one) for your aftercare needs so that they can reassess your need for medications and monitor your lab values.     Increase activity slowly    Complete by:  As directed              Discharge Medications       Medication List    STOP taking these medications        PRESCRIPTION MEDICATION     traZODone 50 MG tablet  Commonly known as:  DESYREL      TAKE these medications        acetaminophen 500 MG tablet  Commonly known as:  TYLENOL  Take 1,000 mg by mouth 2 (two) times daily as needed for mild pain.     aspirin 81 MG chewable tablet  Chew 81 mg by mouth daily before supper.     BALMEX EX  Apply 1 application topically 2 (two) times daily. Apply to right lower leg and eczema areas twice daily     BELSOMRA 10 MG Tabs  Generic drug:  Suvorexant  Take 10 mg by mouth at bedtime.     calamine lotion  Apply 1 application topically daily as needed (rash).     Cranberry 450 MG Caps  Take 450 mg by mouth 2 (two) times daily.     DEPAKOTE ER 250 MG 24 hr tablet  Generic drug:  divalproex  Take 250 mg by mouth at bedtime.     desmopressin 0.2 MG tablet  Commonly known as:  DDAVP  Take 0.2 mg by mouth 2 (two) times daily.     docusate sodium 100 MG capsule  Commonly known as:  COLACE   Take 1 capsule (100 mg total) by mouth 2 (two) times daily as needed for mild constipation.     donepezil 5 MG tablet  Commonly known as:  ARICEPT  Take 1 tablet (5 mg total) by mouth daily before supper.     ergocalciferol 50000 units capsule  Commonly known as:  VITAMIN D2  Take 50,000 Units by mouth once a week. Thursdays     ferrous sulfate 325 (65 FE) MG tablet  Take 325 mg by mouth daily with breakfast.     levothyroxine 75 MCG tablet  Commonly known as:  SYNTHROID, LEVOTHROID  Take 75 mcg by mouth daily before breakfast.     magnesium hydroxide 400 MG/5ML suspension  Commonly known as:  MILK OF MAGNESIA  Take 30 mLs by mouth at bedtime.     memantine 10 MG tablet  Commonly known as:  NAMENDA  Take 1 tablet (10 mg total) by mouth daily.     multivitamin with minerals Tabs tablet  Take 1 tablet by mouth daily before supper.     pravastatin 20 MG tablet  Commonly known as:  PRAVACHOL  Take 20 mg by mouth daily before supper.     PROBIOTIC PO  Take 1 tablet by mouth daily.     sennosides-docusate sodium 8.6-50 MG tablet  Commonly known as:  SENOKOT-S  Take 1 tablet by mouth 2 (two) times daily.        Major procedures and Radiology Reports - PLEASE review detailed and final reports for all details, in brief -       Dg Chest 2 View  12/04/2015  CLINICAL DATA:  Basilar arouse on physical examination. Unresponsiveness and diagonal breathing. EXAM: CHEST - 2 VIEW COMPARISON:  11/20/2015 FINDINGS: progressive airspace infiltrate  in the left lower lung, likely predominantly in the lower lobe but also potentially in the lingula. Findings are consistent with pneumonia. There also is increased opacity at the right lung base, less severe. This could also represent an acute infiltrate. The heart size and mediastinal contours are normal. No pleural effusions or pneumothorax. The visualized skeletal structures are unremarkable. IMPRESSION: Left lower lung pneumonia. Also  potential milder right basilar infiltrate. Electronically Signed   By: Irish Lack M.D.   On: 12/04/2015 19:45   Ct Head Wo Contrast  12/29/2015  CLINICAL DATA:  Altered mental status.  Confusion, unsteady gait. EXAM: CT HEAD WITHOUT CONTRAST TECHNIQUE: Contiguous axial images were obtained from the base of the skull through the vertex without intravenous contrast. COMPARISON:  12/04/2015 FINDINGS: There is atrophy and chronic small vessel disease changes. No acute intracranial abnormality. Specifically, no hemorrhage, hydrocephalus, mass lesion, acute infarction, or significant intracranial injury. No acute calvarial abnormality. Visualized paranasal sinuses and mastoids clear. Orbital soft tissues unremarkable. IMPRESSION: No acute intracranial abnormality. Atrophy, chronic microvascular disease. Electronically Signed   By: Charlett Nose M.D.   On: 12/29/2015 11:56   Ct Head Wo Contrast  12/04/2015  CLINICAL DATA:  Acute onset of altered mental status. Vomiting. Initial encounter. EXAM: CT HEAD WITHOUT CONTRAST TECHNIQUE: Contiguous axial images were obtained from the base of the skull through the vertex without intravenous contrast. COMPARISON:  None. FINDINGS: There is no evidence of acute infarction, mass lesion, or intra- or extra-axial hemorrhage on CT. Prominence of ventricles and sulci reflects mild to moderate cortical volume loss. Scattered periventricular white matter change likely reflects small vessel ischemic microangiopathy. The brainstem and fourth ventricle are within normal limits. The basal ganglia are unremarkable in appearance. The cerebral hemispheres demonstrate grossly normal gray-white differentiation. No mass effect or midline shift is seen. There is no evidence of fracture; visualized osseous structures are unremarkable in appearance. The visualized portions of the orbits are within normal limits. The paranasal sinuses and mastoid air cells are well-aerated. No significant soft  tissue abnormalities are seen. IMPRESSION: 1. No acute intracranial pathology seen on CT. 2. Mild to moderate cortical volume loss and scattered small vessel ischemic microangiopathy. Electronically Signed   By: Roanna Raider M.D.   On: 12/04/2015 20:14   Dg Abd Portable 1v  12/04/2015  CLINICAL DATA:  One day history of lower abdominal pain EXAM: PORTABLE ABDOMEN - 1 VIEW COMPARISON:  CT abdomen and pelvis June 02, 2013 FINDINGS: There is moderate stool throughout the colon. There is no bowel dilatation or air-fluid level suggesting obstruction. No free air is seen on this supine examination. There are vascular calcifications in the pelvis. IMPRESSION: Bowel gas pattern unremarkable without obstruction or free air. Moderate stool throughout colon. Electronically Signed   By: Bretta Bang III M.D.   On: 12/04/2015 23:32    Micro Results      Recent Results (from the past 240 hour(s))  Urine culture     Status: None   Collection Time: 12/29/15 12:29 PM  Result Value Ref Range Status   Specimen Description URINE, CATHETERIZED  Final   Special Requests NONE  Final   Culture NO GROWTH 1 DAY  Final   Report Status 12/30/2015 FINAL  Final  MRSA PCR Screening     Status: None   Collection Time: 12/29/15  6:44 PM  Result Value Ref Range Status   MRSA by PCR NEGATIVE NEGATIVE Final    Comment:  The GeneXpert MRSA Assay (FDA approved for NASAL specimens only), is one component of a comprehensive MRSA colonization surveillance program. It is not intended to diagnose MRSA infection nor to guide or monitor treatment for MRSA infections.        Today   Subjective    Christina Garcia today has no headache,no chest abdominal pain,no new weakness tingling or numbness, feels much better,Unreliable historian    Objective   Blood pressure 118/52, pulse 79, temperature 98.9 F (37.2 C), temperature source Oral, resp. rate 16, height 4\' 11"  (1.499 m), weight 51 kg (112 lb 7 oz),  SpO2 97 %.   Intake/Output Summary (Last 24 hours) at 01/02/16 1219 Last data filed at 01/02/16 1032  Gross per 24 hour  Intake    240 ml  Output      1 ml  Net    239 ml    Exam Awake But pleasantly confused, No new F.N deficits, happy and pleasant affect [place].AT,PERRAL Supple Neck,No JVD, No cervical lymphadenopathy appriciated.  Symmetrical Chest wall movement, Good air movement bilaterally, CTAB RRR,No Gallops,Rubs or new Murmurs, No Parasternal Heave +ve B.Sounds, Abd Soft, Non tender, No organomegaly appriciated, No rebound -guarding or rigidity. No Cyanosis, Clubbing or edema, No new Rash or bruise   Data Review   CBC w Diff: Lab Results  Component Value Date   WBC 5.0 12/30/2015   HGB 11.7* 12/30/2015   HCT 39.3 12/30/2015   PLT 155 12/30/2015   LYMPHOPCT 22 02/21/2015   MONOPCT 11 02/21/2015   EOSPCT 2 02/21/2015   BASOPCT 0 02/21/2015    CMP: Lab Results  Component Value Date   NA 133* 01/02/2016   K 4.1 01/02/2016   CL 101 01/02/2016   CO2 24 01/02/2016   BUN 12 01/02/2016   CREATININE 0.61 01/02/2016   PROT 6.9 12/30/2015   ALBUMIN 3.5 12/30/2015   BILITOT 0.9 12/30/2015   ALKPHOS 67 12/30/2015   AST 30 12/30/2015   ALT 19 12/30/2015  .   Total Time in preparing paper work, data evaluation and todays exam - 35 minutes  Leroy Sea M.D on 01/02/2016 at 12:19 PM  [facility]   Office  [phone]

## 2016-01-02 NOTE — Discharge Instructions (Signed)
Follow with Primary MD Garlan Fillers, MD in 7 days   Get CBC, CMP, 2 view Chest X ray checked  by SNF MD in 3-4 days.    Activity: As tolerated with Full fall precautions use walker/cane & assistance as needed   Disposition SNF   Diet:   Heart Healthy  with feeding assistance and aspiration precautions.  For Heart failure patients - Check your Weight same time everyday, if you gain over 2 pounds, or you develop in leg swelling, experience more shortness of breath or chest pain, call your Primary MD immediately. Follow Cardiac Low Salt Diet and 1.5 lit/day fluid restriction.   On your next visit with your primary care physician please Get Medicines reviewed and adjusted.   Please request your Prim.MD to go over all Hospital Tests and Procedure/Radiological results at the follow up, please get all Hospital records sent to your Prim MD by signing hospital release before you go home.   If you experience worsening of your admission symptoms, develop shortness of breath, life threatening emergency, suicidal or homicidal thoughts you must seek medical attention immediately by calling 911 or calling your MD immediately  if symptoms less severe.  You Must read complete instructions/literature along with all the possible adverse reactions/side effects for all the Medicines you take and that have been prescribed to you. Take any new Medicines after you have completely understood and accpet all the possible adverse reactions/side effects.   Do not drive, operating heavy machinery, perform activities at heights, swimming or participation in water activities or provide baby sitting services if your were admitted for syncope or siezures until you have seen by Primary MD or a Neurologist and advised to do so again.  Do not drive when taking Pain medications.    Do not take more than prescribed Pain, Sleep and Anxiety Medications  Special Instructions: If you have smoked or chewed Tobacco  in the  last 2 yrs please stop smoking, stop any regular Alcohol  and or any Recreational drug use.  Wear Seat belts while driving.   Please note  You were cared for by a hospitalist during your hospital stay. If you have any questions about your discharge medications or the care you received while you were in the hospital after you are discharged, you can call the unit and asked to speak with the hospitalist on call if the hospitalist that took care of you is not available. Once you are discharged, your primary care physician will handle any further medical issues. Please note that NO REFILLS for any discharge medications will be authorized once you are discharged, as it is imperative that you return to your primary care physician (or establish a relationship with a primary care physician if you do not have one) for your aftercare needs so that they can reassess your need for medications and monitor your lab values.

## 2016-01-02 NOTE — Clinical Social Work Note (Signed)
Clinical Social Work Assessment  Patient Details  Name: Christina Garcia MRN: [id number] Date of Birth: March 06, 1933  Date of referral:  01/01/16               Reason for consult:  Facility Placement                Permission sought to share information with:  Other (Patient oriented to self only. Talked with nephew and POA [name]) Permission granted to share information::  No (Permission not granted by patient as oriented to self only)  Name::     [name]  Agency::     Relationship::  Nephew and POA  Contact Information:  [phone]  Housing/Transportation Living arrangements for the past 2 months:  Assisted Living Facility ([facility] - [place], [place]) Source of Information:  Power of Attorney (Nephew [name]) Patient Interpreter Needed:  None Criminal Activity/Legal Involvement Pertinent to Current Situation/Hospitalization:  No - Comment as needed Significant Relationships:    Lives with:  Relatives   Do you feel safe going back to the place where you live?  Yes ([name] very comfortable with patient going back to ALF (in lieu of SNF as recommended by PT) as small setting and patient familiar with facility.) Need for family participation in patient care:  Yes (Comment)  Care giving concerns:  None expressed by nephew. Patient is from an ALF and [name] is pleased with the care patient is receiving there.  Social Worker assessment / plan:  On 01/01/16 CSW talked with nephew and POA [name] regarding discharge planning and recommendation of ST rehab. [name] reported that patient has been at [facility] since mid-December of 2016 and he is pleased with the care she is receiving there. He wants patient to return to the Assisted Living facility as it is smaller and she is familiar with the facility. CSW advised that patient has a son, [name], [name]. who is not involved with patient. There is also a niece [name]) who  used to take care of patient in the home.    Employment status:  Retired Wellsite geologist PT Recommendations:  Skilled Nursing Facility (PT recommended SNF, however patient returning to ALF) Information / Referral to community resources:  Other (Comment Required) (None needed or requested at this time)  Patient/Family's Response to care:  No concerns expressed regarding the care patient receiving during hospitalization.  Patient/Family's Understanding of and Emotional Response to Diagnosis, Current Treatment, and Prognosis:  MNot discussed.  Emotional Assessment Appearance:  Appears stated age Attitude/Demeanor/Rapport:  Other (Appropriate) Affect (typically observed):  Calm, Pleasant Orientation:  Oriented to Self Alcohol / Substance use:  Never Used Psych involvement (Current and /or in the community):  Yes (Comment) (Seen by psych on 5/8 due to dementia)  Discharge Needs  Concerns to be addressed:  Discharge Planning Concerns Readmission within the last 30 days:  Yes Current discharge risk:  None Barriers to Discharge:  No Barriers Identified   Cristobal Goldmann, LCSW 01/02/2016, 3:45 PM

## 2016-01-05 DIAGNOSIS — N179 Acute kidney failure, unspecified: Secondary | ICD-10-CM | POA: Diagnosis not present

## 2016-01-21 DIAGNOSIS — E871 Hypo-osmolality and hyponatremia: Secondary | ICD-10-CM | POA: Diagnosis not present

## 2016-01-21 DIAGNOSIS — G308 Other Alzheimer's disease: Secondary | ICD-10-CM | POA: Diagnosis not present

## 2016-01-21 DIAGNOSIS — E232 Diabetes insipidus: Secondary | ICD-10-CM | POA: Diagnosis not present

## 2016-01-23 DIAGNOSIS — E870 Hyperosmolality and hypernatremia: Secondary | ICD-10-CM | POA: Diagnosis not present

## 2016-02-08 DIAGNOSIS — R627 Adult failure to thrive: Secondary | ICD-10-CM | POA: Diagnosis not present

## 2016-02-08 DIAGNOSIS — N179 Acute kidney failure, unspecified: Secondary | ICD-10-CM | POA: Diagnosis not present

## 2016-02-08 DIAGNOSIS — E039 Hypothyroidism, unspecified: Secondary | ICD-10-CM | POA: Diagnosis not present

## 2016-02-08 DIAGNOSIS — N3944 Nocturnal enuresis: Secondary | ICD-10-CM | POA: Diagnosis not present

## 2016-02-08 DIAGNOSIS — F0150 Vascular dementia without behavioral disturbance: Secondary | ICD-10-CM | POA: Diagnosis not present

## 2016-02-08 DIAGNOSIS — I10 Essential (primary) hypertension: Secondary | ICD-10-CM | POA: Diagnosis not present

## 2016-02-08 DIAGNOSIS — D509 Iron deficiency anemia, unspecified: Secondary | ICD-10-CM | POA: Diagnosis not present

## 2016-02-08 DIAGNOSIS — R600 Localized edema: Secondary | ICD-10-CM | POA: Diagnosis not present

## 2016-02-08 DIAGNOSIS — K5900 Constipation, unspecified: Secondary | ICD-10-CM | POA: Diagnosis not present

## 2016-02-08 DIAGNOSIS — E559 Vitamin D deficiency, unspecified: Secondary | ICD-10-CM | POA: Diagnosis not present

## 2016-02-08 DIAGNOSIS — E782 Mixed hyperlipidemia: Secondary | ICD-10-CM | POA: Diagnosis not present

## 2016-02-08 DIAGNOSIS — K210 Gastro-esophageal reflux disease with esophagitis: Secondary | ICD-10-CM | POA: Diagnosis not present

## 2016-02-13 DIAGNOSIS — R21 Rash and other nonspecific skin eruption: Secondary | ICD-10-CM | POA: Diagnosis not present

## 2016-02-13 DIAGNOSIS — K5900 Constipation, unspecified: Secondary | ICD-10-CM | POA: Diagnosis not present

## 2016-02-13 DIAGNOSIS — F4322 Adjustment disorder with anxiety: Secondary | ICD-10-CM | POA: Diagnosis not present

## 2016-02-13 DIAGNOSIS — N390 Urinary tract infection, site not specified: Secondary | ICD-10-CM | POA: Diagnosis not present

## 2016-02-13 DIAGNOSIS — G301 Alzheimer's disease with late onset: Secondary | ICD-10-CM | POA: Diagnosis not present

## 2016-02-13 DIAGNOSIS — F5101 Primary insomnia: Secondary | ICD-10-CM | POA: Diagnosis not present

## 2016-03-01 DIAGNOSIS — M2010 Hallux valgus (acquired), unspecified foot: Secondary | ICD-10-CM | POA: Diagnosis not present

## 2016-03-01 DIAGNOSIS — L853 Xerosis cutis: Secondary | ICD-10-CM | POA: Diagnosis not present

## 2016-03-01 DIAGNOSIS — F0150 Vascular dementia without behavioral disturbance: Secondary | ICD-10-CM | POA: Diagnosis not present

## 2016-03-07 DIAGNOSIS — F0150 Vascular dementia without behavioral disturbance: Secondary | ICD-10-CM | POA: Diagnosis not present

## 2016-03-07 DIAGNOSIS — K210 Gastro-esophageal reflux disease with esophagitis: Secondary | ICD-10-CM | POA: Diagnosis not present

## 2016-03-07 DIAGNOSIS — E782 Mixed hyperlipidemia: Secondary | ICD-10-CM | POA: Diagnosis not present

## 2016-03-07 DIAGNOSIS — E039 Hypothyroidism, unspecified: Secondary | ICD-10-CM | POA: Diagnosis not present

## 2016-03-07 DIAGNOSIS — N179 Acute kidney failure, unspecified: Secondary | ICD-10-CM | POA: Diagnosis not present

## 2016-03-07 DIAGNOSIS — K5900 Constipation, unspecified: Secondary | ICD-10-CM | POA: Diagnosis not present

## 2016-03-07 DIAGNOSIS — I10 Essential (primary) hypertension: Secondary | ICD-10-CM | POA: Diagnosis not present

## 2016-03-07 DIAGNOSIS — N3944 Nocturnal enuresis: Secondary | ICD-10-CM | POA: Diagnosis not present

## 2016-03-07 DIAGNOSIS — R627 Adult failure to thrive: Secondary | ICD-10-CM | POA: Diagnosis not present

## 2016-03-07 DIAGNOSIS — E559 Vitamin D deficiency, unspecified: Secondary | ICD-10-CM | POA: Diagnosis not present

## 2016-03-07 DIAGNOSIS — R600 Localized edema: Secondary | ICD-10-CM | POA: Diagnosis not present

## 2016-03-07 DIAGNOSIS — D509 Iron deficiency anemia, unspecified: Secondary | ICD-10-CM | POA: Diagnosis not present

## 2016-03-12 DIAGNOSIS — G301 Alzheimer's disease with late onset: Secondary | ICD-10-CM | POA: Diagnosis not present

## 2016-03-12 DIAGNOSIS — F5101 Primary insomnia: Secondary | ICD-10-CM | POA: Diagnosis not present

## 2016-03-19 DIAGNOSIS — R21 Rash and other nonspecific skin eruption: Secondary | ICD-10-CM | POA: Diagnosis not present

## 2016-03-19 DIAGNOSIS — L209 Atopic dermatitis, unspecified: Secondary | ICD-10-CM | POA: Diagnosis not present

## 2016-03-26 DIAGNOSIS — R21 Rash and other nonspecific skin eruption: Secondary | ICD-10-CM | POA: Diagnosis not present

## 2016-03-26 DIAGNOSIS — R8290 Unspecified abnormal findings in urine: Secondary | ICD-10-CM | POA: Diagnosis not present

## 2016-03-26 DIAGNOSIS — E038 Other specified hypothyroidism: Secondary | ICD-10-CM | POA: Diagnosis not present

## 2016-03-26 DIAGNOSIS — L209 Atopic dermatitis, unspecified: Secondary | ICD-10-CM | POA: Diagnosis not present

## 2016-03-29 DIAGNOSIS — N390 Urinary tract infection, site not specified: Secondary | ICD-10-CM | POA: Diagnosis not present

## 2016-04-02 DIAGNOSIS — N390 Urinary tract infection, site not specified: Secondary | ICD-10-CM | POA: Diagnosis not present

## 2016-04-04 DIAGNOSIS — N179 Acute kidney failure, unspecified: Secondary | ICD-10-CM | POA: Diagnosis not present

## 2016-04-04 DIAGNOSIS — E039 Hypothyroidism, unspecified: Secondary | ICD-10-CM | POA: Diagnosis not present

## 2016-04-04 DIAGNOSIS — R600 Localized edema: Secondary | ICD-10-CM | POA: Diagnosis not present

## 2016-04-04 DIAGNOSIS — I10 Essential (primary) hypertension: Secondary | ICD-10-CM | POA: Diagnosis not present

## 2016-04-04 DIAGNOSIS — D509 Iron deficiency anemia, unspecified: Secondary | ICD-10-CM | POA: Diagnosis not present

## 2016-04-04 DIAGNOSIS — E559 Vitamin D deficiency, unspecified: Secondary | ICD-10-CM | POA: Diagnosis not present

## 2016-04-04 DIAGNOSIS — K210 Gastro-esophageal reflux disease with esophagitis: Secondary | ICD-10-CM | POA: Diagnosis not present

## 2016-04-04 DIAGNOSIS — F0150 Vascular dementia without behavioral disturbance: Secondary | ICD-10-CM | POA: Diagnosis not present

## 2016-04-04 DIAGNOSIS — K5900 Constipation, unspecified: Secondary | ICD-10-CM | POA: Diagnosis not present

## 2016-04-04 DIAGNOSIS — N3944 Nocturnal enuresis: Secondary | ICD-10-CM | POA: Diagnosis not present

## 2016-04-04 DIAGNOSIS — E782 Mixed hyperlipidemia: Secondary | ICD-10-CM | POA: Diagnosis not present

## 2016-04-04 DIAGNOSIS — R627 Adult failure to thrive: Secondary | ICD-10-CM | POA: Diagnosis not present

## 2016-04-23 DIAGNOSIS — G301 Alzheimer's disease with late onset: Secondary | ICD-10-CM | POA: Diagnosis not present

## 2016-04-23 DIAGNOSIS — F5101 Primary insomnia: Secondary | ICD-10-CM | POA: Diagnosis not present

## 2016-05-02 DIAGNOSIS — K210 Gastro-esophageal reflux disease with esophagitis: Secondary | ICD-10-CM | POA: Diagnosis not present

## 2016-05-02 DIAGNOSIS — R600 Localized edema: Secondary | ICD-10-CM | POA: Diagnosis not present

## 2016-05-02 DIAGNOSIS — Z7982 Long term (current) use of aspirin: Secondary | ICD-10-CM | POA: Diagnosis not present

## 2016-05-02 DIAGNOSIS — K5900 Constipation, unspecified: Secondary | ICD-10-CM | POA: Diagnosis not present

## 2016-05-02 DIAGNOSIS — E559 Vitamin D deficiency, unspecified: Secondary | ICD-10-CM | POA: Diagnosis not present

## 2016-05-02 DIAGNOSIS — D509 Iron deficiency anemia, unspecified: Secondary | ICD-10-CM | POA: Diagnosis not present

## 2016-05-02 DIAGNOSIS — E039 Hypothyroidism, unspecified: Secondary | ICD-10-CM | POA: Diagnosis not present

## 2016-05-02 DIAGNOSIS — E782 Mixed hyperlipidemia: Secondary | ICD-10-CM | POA: Diagnosis not present

## 2016-05-02 DIAGNOSIS — I10 Essential (primary) hypertension: Secondary | ICD-10-CM | POA: Diagnosis not present

## 2016-05-02 DIAGNOSIS — N179 Acute kidney failure, unspecified: Secondary | ICD-10-CM | POA: Diagnosis not present

## 2016-05-02 DIAGNOSIS — N3944 Nocturnal enuresis: Secondary | ICD-10-CM | POA: Diagnosis not present

## 2016-05-02 DIAGNOSIS — R627 Adult failure to thrive: Secondary | ICD-10-CM | POA: Diagnosis not present

## 2016-05-21 DIAGNOSIS — D509 Iron deficiency anemia, unspecified: Secondary | ICD-10-CM | POA: Diagnosis not present

## 2016-05-21 DIAGNOSIS — Z7982 Long term (current) use of aspirin: Secondary | ICD-10-CM | POA: Diagnosis not present

## 2016-05-21 DIAGNOSIS — K210 Gastro-esophageal reflux disease with esophagitis: Secondary | ICD-10-CM | POA: Diagnosis not present

## 2016-05-21 DIAGNOSIS — R2241 Localized swelling, mass and lump, right lower limb: Secondary | ICD-10-CM | POA: Diagnosis not present

## 2016-05-21 DIAGNOSIS — E782 Mixed hyperlipidemia: Secondary | ICD-10-CM | POA: Diagnosis not present

## 2016-05-21 DIAGNOSIS — E039 Hypothyroidism, unspecified: Secondary | ICD-10-CM | POA: Diagnosis not present

## 2016-05-21 DIAGNOSIS — I10 Essential (primary) hypertension: Secondary | ICD-10-CM | POA: Diagnosis not present

## 2016-05-21 DIAGNOSIS — E559 Vitamin D deficiency, unspecified: Secondary | ICD-10-CM | POA: Diagnosis not present

## 2016-05-21 DIAGNOSIS — F5101 Primary insomnia: Secondary | ICD-10-CM | POA: Diagnosis not present

## 2016-05-21 DIAGNOSIS — K5900 Constipation, unspecified: Secondary | ICD-10-CM | POA: Diagnosis not present

## 2016-05-21 DIAGNOSIS — R627 Adult failure to thrive: Secondary | ICD-10-CM | POA: Diagnosis not present

## 2016-05-21 DIAGNOSIS — N3944 Nocturnal enuresis: Secondary | ICD-10-CM | POA: Diagnosis not present

## 2016-05-21 DIAGNOSIS — G301 Alzheimer's disease with late onset: Secondary | ICD-10-CM | POA: Diagnosis not present

## 2016-05-21 DIAGNOSIS — R600 Localized edema: Secondary | ICD-10-CM | POA: Diagnosis not present

## 2016-05-21 DIAGNOSIS — N179 Acute kidney failure, unspecified: Secondary | ICD-10-CM | POA: Diagnosis not present

## 2016-06-07 DIAGNOSIS — Q845 Enlarged and hypertrophic nails: Secondary | ICD-10-CM | POA: Diagnosis not present

## 2016-06-07 DIAGNOSIS — L603 Nail dystrophy: Secondary | ICD-10-CM | POA: Diagnosis not present

## 2016-06-07 DIAGNOSIS — B351 Tinea unguium: Secondary | ICD-10-CM | POA: Diagnosis not present

## 2016-06-07 DIAGNOSIS — L853 Xerosis cutis: Secondary | ICD-10-CM | POA: Diagnosis not present

## 2016-06-07 DIAGNOSIS — M2010 Hallux valgus (acquired), unspecified foot: Secondary | ICD-10-CM | POA: Diagnosis not present

## 2016-06-07 DIAGNOSIS — F0150 Vascular dementia without behavioral disturbance: Secondary | ICD-10-CM | POA: Diagnosis not present

## 2016-06-18 DIAGNOSIS — G301 Alzheimer's disease with late onset: Secondary | ICD-10-CM | POA: Diagnosis not present

## 2016-06-18 DIAGNOSIS — F5101 Primary insomnia: Secondary | ICD-10-CM | POA: Diagnosis not present

## 2016-06-25 DIAGNOSIS — K5900 Constipation, unspecified: Secondary | ICD-10-CM | POA: Diagnosis not present

## 2016-06-25 DIAGNOSIS — I10 Essential (primary) hypertension: Secondary | ICD-10-CM | POA: Diagnosis not present

## 2016-06-25 DIAGNOSIS — K210 Gastro-esophageal reflux disease with esophagitis: Secondary | ICD-10-CM | POA: Diagnosis not present

## 2016-06-25 DIAGNOSIS — N3944 Nocturnal enuresis: Secondary | ICD-10-CM | POA: Diagnosis not present

## 2016-06-25 DIAGNOSIS — R8290 Unspecified abnormal findings in urine: Secondary | ICD-10-CM | POA: Diagnosis not present

## 2016-06-25 DIAGNOSIS — F0150 Vascular dementia without behavioral disturbance: Secondary | ICD-10-CM | POA: Diagnosis not present

## 2016-06-25 DIAGNOSIS — E782 Mixed hyperlipidemia: Secondary | ICD-10-CM | POA: Diagnosis not present

## 2016-06-25 DIAGNOSIS — D509 Iron deficiency anemia, unspecified: Secondary | ICD-10-CM | POA: Diagnosis not present

## 2016-06-25 DIAGNOSIS — R600 Localized edema: Secondary | ICD-10-CM | POA: Diagnosis not present

## 2016-06-25 DIAGNOSIS — E559 Vitamin D deficiency, unspecified: Secondary | ICD-10-CM | POA: Diagnosis not present

## 2016-06-25 DIAGNOSIS — N179 Acute kidney failure, unspecified: Secondary | ICD-10-CM | POA: Diagnosis not present

## 2016-06-25 DIAGNOSIS — E039 Hypothyroidism, unspecified: Secondary | ICD-10-CM | POA: Diagnosis not present

## 2016-07-23 DIAGNOSIS — G301 Alzheimer's disease with late onset: Secondary | ICD-10-CM | POA: Diagnosis not present

## 2016-07-23 DIAGNOSIS — E782 Mixed hyperlipidemia: Secondary | ICD-10-CM | POA: Diagnosis not present

## 2016-07-23 DIAGNOSIS — E039 Hypothyroidism, unspecified: Secondary | ICD-10-CM | POA: Diagnosis not present

## 2016-07-23 DIAGNOSIS — R600 Localized edema: Secondary | ICD-10-CM | POA: Diagnosis not present

## 2016-07-23 DIAGNOSIS — D509 Iron deficiency anemia, unspecified: Secondary | ICD-10-CM | POA: Diagnosis not present

## 2016-07-23 DIAGNOSIS — I10 Essential (primary) hypertension: Secondary | ICD-10-CM | POA: Diagnosis not present

## 2016-07-23 DIAGNOSIS — K5900 Constipation, unspecified: Secondary | ICD-10-CM | POA: Diagnosis not present

## 2016-07-23 DIAGNOSIS — F5101 Primary insomnia: Secondary | ICD-10-CM | POA: Diagnosis not present

## 2016-07-23 DIAGNOSIS — K210 Gastro-esophageal reflux disease with esophagitis: Secondary | ICD-10-CM | POA: Diagnosis not present

## 2016-07-23 DIAGNOSIS — N179 Acute kidney failure, unspecified: Secondary | ICD-10-CM | POA: Diagnosis not present

## 2016-07-23 DIAGNOSIS — R8290 Unspecified abnormal findings in urine: Secondary | ICD-10-CM | POA: Diagnosis not present

## 2016-07-23 DIAGNOSIS — F0150 Vascular dementia without behavioral disturbance: Secondary | ICD-10-CM | POA: Diagnosis not present

## 2016-07-23 DIAGNOSIS — N3944 Nocturnal enuresis: Secondary | ICD-10-CM | POA: Diagnosis not present

## 2016-07-23 DIAGNOSIS — E559 Vitamin D deficiency, unspecified: Secondary | ICD-10-CM | POA: Diagnosis not present

## 2016-08-06 DIAGNOSIS — N390 Urinary tract infection, site not specified: Secondary | ICD-10-CM | POA: Diagnosis not present

## 2016-08-20 DIAGNOSIS — N179 Acute kidney failure, unspecified: Secondary | ICD-10-CM | POA: Diagnosis not present

## 2016-08-20 DIAGNOSIS — F5089 Other specified eating disorder: Secondary | ICD-10-CM | POA: Diagnosis not present

## 2016-08-20 DIAGNOSIS — R8290 Unspecified abnormal findings in urine: Secondary | ICD-10-CM | POA: Diagnosis not present

## 2016-08-20 DIAGNOSIS — R600 Localized edema: Secondary | ICD-10-CM | POA: Diagnosis not present

## 2016-08-20 DIAGNOSIS — E782 Mixed hyperlipidemia: Secondary | ICD-10-CM | POA: Diagnosis not present

## 2016-08-20 DIAGNOSIS — K210 Gastro-esophageal reflux disease with esophagitis: Secondary | ICD-10-CM | POA: Diagnosis not present

## 2016-08-20 DIAGNOSIS — E039 Hypothyroidism, unspecified: Secondary | ICD-10-CM | POA: Diagnosis not present

## 2016-08-20 DIAGNOSIS — D509 Iron deficiency anemia, unspecified: Secondary | ICD-10-CM | POA: Diagnosis not present

## 2016-08-20 DIAGNOSIS — K5900 Constipation, unspecified: Secondary | ICD-10-CM | POA: Diagnosis not present

## 2016-08-20 DIAGNOSIS — N3944 Nocturnal enuresis: Secondary | ICD-10-CM | POA: Diagnosis not present

## 2016-08-20 DIAGNOSIS — I10 Essential (primary) hypertension: Secondary | ICD-10-CM | POA: Diagnosis not present

## 2016-08-20 DIAGNOSIS — E559 Vitamin D deficiency, unspecified: Secondary | ICD-10-CM | POA: Diagnosis not present

## 2016-08-27 DIAGNOSIS — R21 Rash and other nonspecific skin eruption: Secondary | ICD-10-CM | POA: Diagnosis not present

## 2016-08-27 DIAGNOSIS — G301 Alzheimer's disease with late onset: Secondary | ICD-10-CM | POA: Diagnosis not present

## 2016-08-27 DIAGNOSIS — L03115 Cellulitis of right lower limb: Secondary | ICD-10-CM | POA: Diagnosis not present

## 2016-08-27 DIAGNOSIS — F5101 Primary insomnia: Secondary | ICD-10-CM | POA: Diagnosis not present

## 2016-09-09 ENCOUNTER — Encounter (HOSPITAL_COMMUNITY): Payer: Self-pay | Admitting: Emergency Medicine

## 2016-09-09 ENCOUNTER — Emergency Department (HOSPITAL_COMMUNITY)
Admission: EM | Admit: 2016-09-09 | Discharge: 2016-09-09 | Disposition: A | Discharge status: Home / Self Care | Payer: PPO | Attending: Internal Medicine | Admitting: Internal Medicine

## 2016-09-09 DIAGNOSIS — Y939 Activity, unspecified: Secondary | ICD-10-CM | POA: Insufficient documentation

## 2016-09-09 DIAGNOSIS — W19XXXA Unspecified fall, initial encounter: Secondary | ICD-10-CM | POA: Insufficient documentation

## 2016-09-09 DIAGNOSIS — S0093XA Contusion of unspecified part of head, initial encounter: Secondary | ICD-10-CM

## 2016-09-09 DIAGNOSIS — S098XXA Other specified injuries of head, initial encounter: Secondary | ICD-10-CM | POA: Diagnosis not present

## 2016-09-09 DIAGNOSIS — Y929 Unspecified place or not applicable: Secondary | ICD-10-CM | POA: Insufficient documentation

## 2016-09-09 DIAGNOSIS — Z7982 Long term (current) use of aspirin: Secondary | ICD-10-CM | POA: Diagnosis not present

## 2016-09-09 DIAGNOSIS — E039 Hypothyroidism, unspecified: Secondary | ICD-10-CM | POA: Diagnosis not present

## 2016-09-09 DIAGNOSIS — Y999 Unspecified external cause status: Secondary | ICD-10-CM | POA: Diagnosis not present

## 2016-09-09 DIAGNOSIS — S0083XA Contusion of other part of head, initial encounter: Secondary | ICD-10-CM | POA: Diagnosis not present

## 2016-09-09 DIAGNOSIS — S0990XA Unspecified injury of head, initial encounter: Secondary | ICD-10-CM | POA: Diagnosis not present

## 2016-09-09 DIAGNOSIS — G309 Alzheimer's disease, unspecified: Secondary | ICD-10-CM | POA: Diagnosis not present

## 2016-09-09 NOTE — ED Notes (Signed)
Bed: WA16 Expected date:  Expected time:  Means of arrival:  Comments: Res A 

## 2016-09-09 NOTE — ED Provider Notes (Signed)
[facility]-EMERGENCY DEPT Provider Note   CSN: [id number] Arrival date & time: 09/09/16  1914     History   Chief Complaint Chief Complaint  Patient presents with  . Fall    HPI Christina Garcia is a 83 y.o. female.  This is a 83 year old female who presents from nursing home with right frontal hematoma after unwitnessed fall this morning. She has a history of dementia and she is a DO NOT RESUSCITATE and does not take any blood thinners. According to her caregiver, she is at her mental baseline. There has been no reported change in her mental status. This but no vomiting. Did have an abrasion to her right forehead initially which then developed into a hematoma which is why she was transported here.      Past Medical History:  Diagnosis Date  . Alzheimer disease   . Anemia   . DDD (degenerative disc disease), lumbar   . Diabetes insipidus (HCC)   . Dry mouth    husband states she has issue with saliva glands and has to take medicaion fot hsi  . Forgetfulness   . GERD (gastroesophageal reflux disease)   . Hypothyroidism   . IBS (irritable bowel syndrome)   . Insomnia   . Osteoporosis   . Rheumatoid arthritis [facility])     Patient Active Problem List   Diagnosis Date Noted  . Acute hypernatremia 12/29/2015  . Syncope 12/04/2015  . HCAP (healthcare-associated pneumonia) 12/04/2015  . UTI (urinary tract infection) 12/04/2015  . Creatinine elevation   . Hypernatremia 11/20/2015  . AKI (acute kidney injury) (HCC) 11/20/2015  . Diabetes insipidus (HCC) 11/20/2015  . Dementia 11/20/2015  . Hypothyroidism 11/20/2015    Past Surgical History:  Procedure Laterality Date  . ABDOMINAL HYSTERECTOMY    . none      OB History    No data available       Home Medications    Prior to Admission medications   Medication Sig Start Date End Date Taking? Authorizing Provider  acetaminophen (TYLENOL) 500 MG tablet Take 1,000 mg by mouth 2 (two) times daily as needed for mild pain.     Historical Provider, MD  aspirin 81 MG chewable tablet Chew 81 mg by mouth daily before supper.    Historical Provider, MD  calamine lotion Apply 1 application topically daily as needed (rash).    Historical Provider, MD  Cranberry 450 MG CAPS Take 450 mg by mouth 2 (two) times daily.    Historical Provider, MD  desmopressin (DDAVP) 0.2 MG tablet Take 0.2 mg by mouth 2 (two) times daily.    Historical Provider, MD  divalproex (DEPAKOTE ER) 250 MG 24 hr tablet Take 250 mg by mouth at bedtime.    Historical Provider, MD  docusate sodium (COLACE) 100 MG capsule Take 1 capsule (100 mg total) by mouth 2 (two) times daily as needed for mild constipation. 01/02/16   Leroy Sea, MD  donepezil (ARICEPT) 5 MG tablet Take 1 tablet (5 mg total) by mouth daily before supper. 01/02/16   Leroy Sea, MD  ergocalciferol (VITAMIN D2) 50000 UNITS capsule Take 50,000 Units by mouth once a week. Thursdays    Historical Provider, MD  ferrous sulfate 325 (65 FE) MG tablet Take 325 mg by mouth daily with breakfast.    Historical Provider, MD  levothyroxine (SYNTHROID, LEVOTHROID) 75 MCG tablet Take 75 mcg by mouth daily before breakfast.    Historical Provider, MD  magnesium hydroxide (MILK OF MAGNESIA) 400  MG/5ML suspension Take 30 mLs by mouth at bedtime.    Historical Provider, MD  memantine (NAMENDA) 10 MG tablet Take 1 tablet (10 mg total) by mouth daily. 01/02/16   Leroy Sea, MD  Multiple Vitamin (MULTIVITAMIN WITH MINERALS) TABS tablet Take 1 tablet by mouth daily before supper.    Historical Provider, MD  pravastatin (PRAVACHOL) 20 MG tablet Take 20 mg by mouth daily before supper.     Historical Provider, MD  Probiotic Product (PROBIOTIC PO) Take 1 tablet by mouth daily.    Historical Provider, MD  sennosides-docusate sodium (SENOKOT-S) 8.6-50 MG tablet Take 1 tablet by mouth 2 (two) times daily.    Historical Provider, MD  Suvorexant (BELSOMRA) 10 MG TABS Take 10 mg by mouth at bedtime.    Historical  Provider, MD  Zinc Oxide (BALMEX EX) Apply 1 application topically 2 (two) times daily. Apply to right lower leg and eczema areas twice daily    Historical Provider, MD    Family History Family History  Problem Relation Age of Onset  . Alzheimer's disease Mother   . Alzheimer's disease Brother     Social History Social History  Substance Use Topics  . Smoking status: Never Smoker  . Smokeless tobacco: Never Used  . Alcohol use No     Allergies   Keflex [cephalexin] and Fosamax [alendronate sodium]   Review of Systems Review of Systems  All other systems reviewed and are negative.    Physical Exam Updated Vital Signs BP 149/83 (BP Location: Right Arm)   Pulse 78   Temp 97.6 F (36.4 C) (Oral)   Resp 15   SpO2 100%   Physical Exam  Constitutional: She is oriented to person, place, and time. She appears well-developed and well-nourished.  Non-toxic appearance. No distress.  HENT:  Head:    Eyes: Conjunctivae, EOM and lids are normal. Pupils are equal, round, and reactive to light.  Neck: Normal range of motion. Neck supple. No tracheal deviation present. No thyroid mass present.  Cardiovascular: Normal rate, regular rhythm and normal heart sounds.  Exam reveals no gallop.   No murmur heard. Pulmonary/Chest: Effort normal and breath sounds normal. No stridor. No respiratory distress. She has no decreased breath sounds. She has no wheezes. She has no rhonchi. She has no rales.  Abdominal: Soft. Normal appearance and bowel sounds are normal. She exhibits no distension. There is no tenderness. There is no rebound and no CVA tenderness.  Musculoskeletal: Normal range of motion. She exhibits no edema or tenderness.  Neurological: She is alert and oriented to person, place, and time. She has normal strength. No cranial nerve deficit or sensory deficit. GCS eye subscore is 4. GCS verbal subscore is 5. GCS motor subscore is 6.  Skin: Skin is warm and dry. No abrasion and no  rash noted.  Psychiatric: She has a normal mood and affect. She is not agitated and not slowed.  Nursing note and vitals reviewed.    ED Treatments / Results  Labs (all labs ordered are listed, but only abnormal results are displayed) Labs Reviewed - No data to display  EKG  EKG Interpretation None       Radiology No results found.  Procedures Procedures (including critical care time)  Medications Ordered in ED Medications - No data to display   Initial Impression / Assessment and Plan / ED Course  I have reviewed the triage vital signs and the nursing notes.  Pertinent labs & imaging results that were  available during my care of the patient were reviewed by me and considered in my medical decision making (see chart for details).  Clinical Course     Long discussion with family about the need for CT of her head. Patient is at her neurological baseline. She has not had any emesis. They agreed to withhold head CT at this time and strict return precautions given  Final Clinical Impressions(s) / ED Diagnoses   Final diagnoses:  None    New Prescriptions New Prescriptions   No medications on file     Lorre Nick, MD 09/09/16 2108

## 2016-09-09 NOTE — ED Notes (Signed)
[facility] notified of need for transport back to [facility]

## 2016-09-09 NOTE — ED Notes (Signed)
Phone report given to [place] at facility.

## 2016-09-09 NOTE — ED Triage Notes (Signed)
Patient BIB [facility] from [facility] [place] for fall that facility reports happened around 6 am this morning. Fall was unwitnessed, no known loss of consciousness, bruise noted to left side of face. Patient has hx of dementia, appropriate to baseline, a/o tho self. Denies pain but states she "just doesn't feel good" patient not on blood thinners. EMS reports family is on their way.

## 2016-09-09 NOTE — ED Notes (Signed)
Bed: RESA Expected date:  Expected time:  Means of arrival:  Comments: 48 Fall 12 hours ago, no blood thinners

## 2016-09-10 DIAGNOSIS — Z9181 History of falling: Secondary | ICD-10-CM | POA: Diagnosis not present

## 2016-09-10 DIAGNOSIS — Z20828 Contact with and (suspected) exposure to other viral communicable diseases: Secondary | ICD-10-CM | POA: Diagnosis not present

## 2016-09-17 DIAGNOSIS — R300 Dysuria: Secondary | ICD-10-CM | POA: Diagnosis not present

## 2016-09-17 DIAGNOSIS — Z0000 Encounter for general adult medical examination without abnormal findings: Secondary | ICD-10-CM | POA: Diagnosis not present

## 2016-09-18 DIAGNOSIS — N390 Urinary tract infection, site not specified: Secondary | ICD-10-CM | POA: Diagnosis not present

## 2016-09-24 DIAGNOSIS — N390 Urinary tract infection, site not specified: Secondary | ICD-10-CM | POA: Diagnosis not present

## 2016-09-26 DIAGNOSIS — F5101 Primary insomnia: Secondary | ICD-10-CM | POA: Diagnosis not present

## 2016-09-26 DIAGNOSIS — G301 Alzheimer's disease with late onset: Secondary | ICD-10-CM | POA: Diagnosis not present

## 2016-10-21 DIAGNOSIS — F5101 Primary insomnia: Secondary | ICD-10-CM | POA: Diagnosis not present

## 2016-10-21 DIAGNOSIS — G301 Alzheimer's disease with late onset: Secondary | ICD-10-CM | POA: Diagnosis not present

## 2016-10-22 DIAGNOSIS — G301 Alzheimer's disease with late onset: Secondary | ICD-10-CM | POA: Diagnosis not present

## 2016-10-22 DIAGNOSIS — F5101 Primary insomnia: Secondary | ICD-10-CM | POA: Diagnosis not present

## 2016-10-24 DIAGNOSIS — N390 Urinary tract infection, site not specified: Secondary | ICD-10-CM | POA: Diagnosis not present

## 2016-10-26 DIAGNOSIS — E039 Hypothyroidism, unspecified: Secondary | ICD-10-CM | POA: Diagnosis not present

## 2016-10-26 DIAGNOSIS — R8290 Unspecified abnormal findings in urine: Secondary | ICD-10-CM | POA: Diagnosis not present

## 2016-10-26 DIAGNOSIS — R600 Localized edema: Secondary | ICD-10-CM | POA: Diagnosis not present

## 2016-10-26 DIAGNOSIS — N3944 Nocturnal enuresis: Secondary | ICD-10-CM | POA: Diagnosis not present

## 2016-10-26 DIAGNOSIS — K210 Gastro-esophageal reflux disease with esophagitis: Secondary | ICD-10-CM | POA: Diagnosis not present

## 2016-10-26 DIAGNOSIS — E782 Mixed hyperlipidemia: Secondary | ICD-10-CM | POA: Diagnosis not present

## 2016-10-26 DIAGNOSIS — E559 Vitamin D deficiency, unspecified: Secondary | ICD-10-CM | POA: Diagnosis not present

## 2016-10-26 DIAGNOSIS — N390 Urinary tract infection, site not specified: Secondary | ICD-10-CM | POA: Diagnosis not present

## 2016-10-26 DIAGNOSIS — I10 Essential (primary) hypertension: Secondary | ICD-10-CM | POA: Diagnosis not present

## 2016-10-26 DIAGNOSIS — K5900 Constipation, unspecified: Secondary | ICD-10-CM | POA: Diagnosis not present

## 2016-10-26 DIAGNOSIS — D509 Iron deficiency anemia, unspecified: Secondary | ICD-10-CM | POA: Diagnosis not present

## 2016-10-26 DIAGNOSIS — F5089 Other specified eating disorder: Secondary | ICD-10-CM | POA: Diagnosis not present

## 2016-10-31 DIAGNOSIS — K5900 Constipation, unspecified: Secondary | ICD-10-CM | POA: Diagnosis not present

## 2016-10-31 DIAGNOSIS — N390 Urinary tract infection, site not specified: Secondary | ICD-10-CM | POA: Diagnosis not present

## 2016-10-31 DIAGNOSIS — R8290 Unspecified abnormal findings in urine: Secondary | ICD-10-CM | POA: Diagnosis not present

## 2016-10-31 DIAGNOSIS — K210 Gastro-esophageal reflux disease with esophagitis: Secondary | ICD-10-CM | POA: Diagnosis not present

## 2016-10-31 DIAGNOSIS — I10 Essential (primary) hypertension: Secondary | ICD-10-CM | POA: Diagnosis not present

## 2016-10-31 DIAGNOSIS — R600 Localized edema: Secondary | ICD-10-CM | POA: Diagnosis not present

## 2016-10-31 DIAGNOSIS — F5089 Other specified eating disorder: Secondary | ICD-10-CM | POA: Diagnosis not present

## 2016-10-31 DIAGNOSIS — E559 Vitamin D deficiency, unspecified: Secondary | ICD-10-CM | POA: Diagnosis not present

## 2016-10-31 DIAGNOSIS — N3944 Nocturnal enuresis: Secondary | ICD-10-CM | POA: Diagnosis not present

## 2016-10-31 DIAGNOSIS — D509 Iron deficiency anemia, unspecified: Secondary | ICD-10-CM | POA: Diagnosis not present

## 2016-10-31 DIAGNOSIS — E782 Mixed hyperlipidemia: Secondary | ICD-10-CM | POA: Diagnosis not present

## 2016-10-31 DIAGNOSIS — E039 Hypothyroidism, unspecified: Secondary | ICD-10-CM | POA: Diagnosis not present

## 2016-11-19 DIAGNOSIS — G301 Alzheimer's disease with late onset: Secondary | ICD-10-CM | POA: Diagnosis not present

## 2016-11-19 DIAGNOSIS — F5101 Primary insomnia: Secondary | ICD-10-CM | POA: Diagnosis not present

## 2016-11-26 DIAGNOSIS — R600 Localized edema: Secondary | ICD-10-CM | POA: Diagnosis not present

## 2016-11-26 DIAGNOSIS — D509 Iron deficiency anemia, unspecified: Secondary | ICD-10-CM | POA: Diagnosis not present

## 2016-11-26 DIAGNOSIS — K210 Gastro-esophageal reflux disease with esophagitis: Secondary | ICD-10-CM | POA: Diagnosis not present

## 2016-11-26 DIAGNOSIS — F5089 Other specified eating disorder: Secondary | ICD-10-CM | POA: Diagnosis not present

## 2016-11-26 DIAGNOSIS — E039 Hypothyroidism, unspecified: Secondary | ICD-10-CM | POA: Diagnosis not present

## 2016-11-26 DIAGNOSIS — N390 Urinary tract infection, site not specified: Secondary | ICD-10-CM | POA: Diagnosis not present

## 2016-11-26 DIAGNOSIS — R8290 Unspecified abnormal findings in urine: Secondary | ICD-10-CM | POA: Diagnosis not present

## 2016-11-26 DIAGNOSIS — I10 Essential (primary) hypertension: Secondary | ICD-10-CM | POA: Diagnosis not present

## 2016-11-26 DIAGNOSIS — K5900 Constipation, unspecified: Secondary | ICD-10-CM | POA: Diagnosis not present

## 2016-11-26 DIAGNOSIS — N3944 Nocturnal enuresis: Secondary | ICD-10-CM | POA: Diagnosis not present

## 2016-11-26 DIAGNOSIS — E559 Vitamin D deficiency, unspecified: Secondary | ICD-10-CM | POA: Diagnosis not present

## 2016-11-26 DIAGNOSIS — E782 Mixed hyperlipidemia: Secondary | ICD-10-CM | POA: Diagnosis not present

## 2016-12-02 DIAGNOSIS — G301 Alzheimer's disease with late onset: Secondary | ICD-10-CM | POA: Diagnosis not present

## 2016-12-02 DIAGNOSIS — F5101 Primary insomnia: Secondary | ICD-10-CM | POA: Diagnosis not present

## 2016-12-03 DIAGNOSIS — R600 Localized edema: Secondary | ICD-10-CM | POA: Diagnosis not present

## 2016-12-10 DIAGNOSIS — R600 Localized edema: Secondary | ICD-10-CM | POA: Diagnosis not present

## 2016-12-17 DIAGNOSIS — R4182 Altered mental status, unspecified: Secondary | ICD-10-CM | POA: Diagnosis not present

## 2016-12-19 DIAGNOSIS — N390 Urinary tract infection, site not specified: Secondary | ICD-10-CM | POA: Diagnosis not present

## 2016-12-24 DIAGNOSIS — N3944 Nocturnal enuresis: Secondary | ICD-10-CM | POA: Diagnosis not present

## 2016-12-24 DIAGNOSIS — K5900 Constipation, unspecified: Secondary | ICD-10-CM | POA: Diagnosis not present

## 2016-12-24 DIAGNOSIS — E782 Mixed hyperlipidemia: Secondary | ICD-10-CM | POA: Diagnosis not present

## 2016-12-24 DIAGNOSIS — E039 Hypothyroidism, unspecified: Secondary | ICD-10-CM | POA: Diagnosis not present

## 2016-12-24 DIAGNOSIS — R8290 Unspecified abnormal findings in urine: Secondary | ICD-10-CM | POA: Diagnosis not present

## 2016-12-24 DIAGNOSIS — E559 Vitamin D deficiency, unspecified: Secondary | ICD-10-CM | POA: Diagnosis not present

## 2016-12-24 DIAGNOSIS — K210 Gastro-esophageal reflux disease with esophagitis: Secondary | ICD-10-CM | POA: Diagnosis not present

## 2016-12-24 DIAGNOSIS — D509 Iron deficiency anemia, unspecified: Secondary | ICD-10-CM | POA: Diagnosis not present

## 2016-12-24 DIAGNOSIS — F5089 Other specified eating disorder: Secondary | ICD-10-CM | POA: Diagnosis not present

## 2016-12-24 DIAGNOSIS — N390 Urinary tract infection, site not specified: Secondary | ICD-10-CM | POA: Diagnosis not present

## 2016-12-24 DIAGNOSIS — R600 Localized edema: Secondary | ICD-10-CM | POA: Diagnosis not present

## 2016-12-24 DIAGNOSIS — I10 Essential (primary) hypertension: Secondary | ICD-10-CM | POA: Diagnosis not present

## 2016-12-27 DIAGNOSIS — I10 Essential (primary) hypertension: Secondary | ICD-10-CM | POA: Diagnosis not present

## 2016-12-27 DIAGNOSIS — E039 Hypothyroidism, unspecified: Secondary | ICD-10-CM | POA: Diagnosis not present

## 2017-01-01 DIAGNOSIS — G301 Alzheimer's disease with late onset: Secondary | ICD-10-CM | POA: Diagnosis not present

## 2017-01-01 DIAGNOSIS — F5101 Primary insomnia: Secondary | ICD-10-CM | POA: Diagnosis not present

## 2017-01-08 DIAGNOSIS — M2010 Hallux valgus (acquired), unspecified foot: Secondary | ICD-10-CM | POA: Diagnosis not present

## 2017-01-08 DIAGNOSIS — Q845 Enlarged and hypertrophic nails: Secondary | ICD-10-CM | POA: Diagnosis not present

## 2017-01-08 DIAGNOSIS — L853 Xerosis cutis: Secondary | ICD-10-CM | POA: Diagnosis not present

## 2017-01-08 DIAGNOSIS — B351 Tinea unguium: Secondary | ICD-10-CM | POA: Diagnosis not present

## 2017-01-08 DIAGNOSIS — L603 Nail dystrophy: Secondary | ICD-10-CM | POA: Diagnosis not present

## 2017-01-21 DIAGNOSIS — E039 Hypothyroidism, unspecified: Secondary | ICD-10-CM | POA: Diagnosis not present

## 2017-01-21 DIAGNOSIS — K210 Gastro-esophageal reflux disease with esophagitis: Secondary | ICD-10-CM | POA: Diagnosis not present

## 2017-01-21 DIAGNOSIS — R600 Localized edema: Secondary | ICD-10-CM | POA: Diagnosis not present

## 2017-01-21 DIAGNOSIS — N390 Urinary tract infection, site not specified: Secondary | ICD-10-CM | POA: Diagnosis not present

## 2017-01-21 DIAGNOSIS — N3944 Nocturnal enuresis: Secondary | ICD-10-CM | POA: Diagnosis not present

## 2017-01-21 DIAGNOSIS — F5101 Primary insomnia: Secondary | ICD-10-CM | POA: Diagnosis not present

## 2017-01-21 DIAGNOSIS — R8290 Unspecified abnormal findings in urine: Secondary | ICD-10-CM | POA: Diagnosis not present

## 2017-01-21 DIAGNOSIS — E782 Mixed hyperlipidemia: Secondary | ICD-10-CM | POA: Diagnosis not present

## 2017-01-21 DIAGNOSIS — E559 Vitamin D deficiency, unspecified: Secondary | ICD-10-CM | POA: Diagnosis not present

## 2017-01-21 DIAGNOSIS — F5089 Other specified eating disorder: Secondary | ICD-10-CM | POA: Diagnosis not present

## 2017-01-21 DIAGNOSIS — D509 Iron deficiency anemia, unspecified: Secondary | ICD-10-CM | POA: Diagnosis not present

## 2017-01-21 DIAGNOSIS — G301 Alzheimer's disease with late onset: Secondary | ICD-10-CM | POA: Diagnosis not present

## 2017-01-21 DIAGNOSIS — I10 Essential (primary) hypertension: Secondary | ICD-10-CM | POA: Diagnosis not present

## 2017-01-21 DIAGNOSIS — K5900 Constipation, unspecified: Secondary | ICD-10-CM | POA: Diagnosis not present

## 2017-02-11 DIAGNOSIS — R8290 Unspecified abnormal findings in urine: Secondary | ICD-10-CM | POA: Diagnosis not present

## 2017-02-11 DIAGNOSIS — E039 Hypothyroidism, unspecified: Secondary | ICD-10-CM | POA: Diagnosis not present

## 2017-02-11 DIAGNOSIS — K210 Gastro-esophageal reflux disease with esophagitis: Secondary | ICD-10-CM | POA: Diagnosis not present

## 2017-02-11 DIAGNOSIS — K5900 Constipation, unspecified: Secondary | ICD-10-CM | POA: Diagnosis not present

## 2017-02-11 DIAGNOSIS — E782 Mixed hyperlipidemia: Secondary | ICD-10-CM | POA: Diagnosis not present

## 2017-02-11 DIAGNOSIS — E559 Vitamin D deficiency, unspecified: Secondary | ICD-10-CM | POA: Diagnosis not present

## 2017-02-11 DIAGNOSIS — N390 Urinary tract infection, site not specified: Secondary | ICD-10-CM | POA: Diagnosis not present

## 2017-02-11 DIAGNOSIS — F5089 Other specified eating disorder: Secondary | ICD-10-CM | POA: Diagnosis not present

## 2017-02-11 DIAGNOSIS — R600 Localized edema: Secondary | ICD-10-CM | POA: Diagnosis not present

## 2017-02-11 DIAGNOSIS — I10 Essential (primary) hypertension: Secondary | ICD-10-CM | POA: Diagnosis not present

## 2017-02-11 DIAGNOSIS — N3944 Nocturnal enuresis: Secondary | ICD-10-CM | POA: Diagnosis not present

## 2017-02-11 DIAGNOSIS — D509 Iron deficiency anemia, unspecified: Secondary | ICD-10-CM | POA: Diagnosis not present

## 2017-02-18 DIAGNOSIS — N390 Urinary tract infection, site not specified: Secondary | ICD-10-CM | POA: Diagnosis not present

## 2017-02-25 DIAGNOSIS — N390 Urinary tract infection, site not specified: Secondary | ICD-10-CM | POA: Diagnosis not present

## 2017-03-18 DIAGNOSIS — K210 Gastro-esophageal reflux disease with esophagitis: Secondary | ICD-10-CM | POA: Diagnosis not present

## 2017-03-18 DIAGNOSIS — N3944 Nocturnal enuresis: Secondary | ICD-10-CM | POA: Diagnosis not present

## 2017-03-18 DIAGNOSIS — F5089 Other specified eating disorder: Secondary | ICD-10-CM | POA: Diagnosis not present

## 2017-03-18 DIAGNOSIS — E559 Vitamin D deficiency, unspecified: Secondary | ICD-10-CM | POA: Diagnosis not present

## 2017-03-18 DIAGNOSIS — N390 Urinary tract infection, site not specified: Secondary | ICD-10-CM | POA: Diagnosis not present

## 2017-03-18 DIAGNOSIS — E039 Hypothyroidism, unspecified: Secondary | ICD-10-CM | POA: Diagnosis not present

## 2017-03-18 DIAGNOSIS — I10 Essential (primary) hypertension: Secondary | ICD-10-CM | POA: Diagnosis not present

## 2017-03-18 DIAGNOSIS — R600 Localized edema: Secondary | ICD-10-CM | POA: Diagnosis not present

## 2017-03-18 DIAGNOSIS — K5900 Constipation, unspecified: Secondary | ICD-10-CM | POA: Diagnosis not present

## 2017-03-18 DIAGNOSIS — R4182 Altered mental status, unspecified: Secondary | ICD-10-CM | POA: Diagnosis not present

## 2017-03-18 DIAGNOSIS — D509 Iron deficiency anemia, unspecified: Secondary | ICD-10-CM | POA: Diagnosis not present

## 2017-03-18 DIAGNOSIS — R8290 Unspecified abnormal findings in urine: Secondary | ICD-10-CM | POA: Diagnosis not present

## 2017-03-21 DIAGNOSIS — N390 Urinary tract infection, site not specified: Secondary | ICD-10-CM | POA: Diagnosis not present

## 2017-03-25 DIAGNOSIS — R4182 Altered mental status, unspecified: Secondary | ICD-10-CM | POA: Diagnosis not present

## 2017-03-25 DIAGNOSIS — N3944 Nocturnal enuresis: Secondary | ICD-10-CM | POA: Diagnosis not present

## 2017-03-31 DIAGNOSIS — E559 Vitamin D deficiency, unspecified: Secondary | ICD-10-CM | POA: Diagnosis not present

## 2017-03-31 DIAGNOSIS — F5089 Other specified eating disorder: Secondary | ICD-10-CM | POA: Diagnosis not present

## 2017-03-31 DIAGNOSIS — E039 Hypothyroidism, unspecified: Secondary | ICD-10-CM | POA: Diagnosis not present

## 2017-03-31 DIAGNOSIS — R8290 Unspecified abnormal findings in urine: Secondary | ICD-10-CM | POA: Diagnosis not present

## 2017-03-31 DIAGNOSIS — R4182 Altered mental status, unspecified: Secondary | ICD-10-CM | POA: Diagnosis not present

## 2017-03-31 DIAGNOSIS — I10 Essential (primary) hypertension: Secondary | ICD-10-CM | POA: Diagnosis not present

## 2017-03-31 DIAGNOSIS — K5900 Constipation, unspecified: Secondary | ICD-10-CM | POA: Diagnosis not present

## 2017-03-31 DIAGNOSIS — D509 Iron deficiency anemia, unspecified: Secondary | ICD-10-CM | POA: Diagnosis not present

## 2017-03-31 DIAGNOSIS — N390 Urinary tract infection, site not specified: Secondary | ICD-10-CM | POA: Diagnosis not present

## 2017-03-31 DIAGNOSIS — N3944 Nocturnal enuresis: Secondary | ICD-10-CM | POA: Diagnosis not present

## 2017-03-31 DIAGNOSIS — R600 Localized edema: Secondary | ICD-10-CM | POA: Diagnosis not present

## 2017-03-31 DIAGNOSIS — K210 Gastro-esophageal reflux disease with esophagitis: Secondary | ICD-10-CM | POA: Diagnosis not present

## 2017-04-07 DIAGNOSIS — F5101 Primary insomnia: Secondary | ICD-10-CM | POA: Diagnosis not present

## 2017-04-07 DIAGNOSIS — G301 Alzheimer's disease with late onset: Secondary | ICD-10-CM | POA: Diagnosis not present

## 2017-04-08 DIAGNOSIS — R21 Rash and other nonspecific skin eruption: Secondary | ICD-10-CM | POA: Diagnosis not present

## 2017-04-11 DIAGNOSIS — I10 Essential (primary) hypertension: Secondary | ICD-10-CM | POA: Diagnosis not present

## 2017-04-11 DIAGNOSIS — E039 Hypothyroidism, unspecified: Secondary | ICD-10-CM | POA: Diagnosis not present

## 2017-04-11 DIAGNOSIS — E785 Hyperlipidemia, unspecified: Secondary | ICD-10-CM | POA: Diagnosis not present

## 2017-04-15 DIAGNOSIS — R600 Localized edema: Secondary | ICD-10-CM | POA: Diagnosis not present

## 2017-04-15 DIAGNOSIS — N390 Urinary tract infection, site not specified: Secondary | ICD-10-CM | POA: Diagnosis not present

## 2017-04-15 DIAGNOSIS — D509 Iron deficiency anemia, unspecified: Secondary | ICD-10-CM | POA: Diagnosis not present

## 2017-04-15 DIAGNOSIS — K5900 Constipation, unspecified: Secondary | ICD-10-CM | POA: Diagnosis not present

## 2017-04-15 DIAGNOSIS — R4182 Altered mental status, unspecified: Secondary | ICD-10-CM | POA: Diagnosis not present

## 2017-04-15 DIAGNOSIS — N3944 Nocturnal enuresis: Secondary | ICD-10-CM | POA: Diagnosis not present

## 2017-04-15 DIAGNOSIS — E782 Mixed hyperlipidemia: Secondary | ICD-10-CM | POA: Diagnosis not present

## 2017-04-15 DIAGNOSIS — E039 Hypothyroidism, unspecified: Secondary | ICD-10-CM | POA: Diagnosis not present

## 2017-04-15 DIAGNOSIS — K210 Gastro-esophageal reflux disease with esophagitis: Secondary | ICD-10-CM | POA: Diagnosis not present

## 2017-04-15 DIAGNOSIS — F5089 Other specified eating disorder: Secondary | ICD-10-CM | POA: Diagnosis not present

## 2017-04-15 DIAGNOSIS — I10 Essential (primary) hypertension: Secondary | ICD-10-CM | POA: Diagnosis not present

## 2017-04-15 DIAGNOSIS — E559 Vitamin D deficiency, unspecified: Secondary | ICD-10-CM | POA: Diagnosis not present

## 2017-04-16 DIAGNOSIS — R531 Weakness: Secondary | ICD-10-CM | POA: Diagnosis not present

## 2017-04-16 DIAGNOSIS — R109 Unspecified abdominal pain: Secondary | ICD-10-CM | POA: Diagnosis not present

## 2017-04-17 DIAGNOSIS — R109 Unspecified abdominal pain: Secondary | ICD-10-CM | POA: Diagnosis not present

## 2017-04-21 DIAGNOSIS — G301 Alzheimer's disease with late onset: Secondary | ICD-10-CM | POA: Diagnosis not present

## 2017-04-21 DIAGNOSIS — F5101 Primary insomnia: Secondary | ICD-10-CM | POA: Diagnosis not present

## 2017-04-22 ENCOUNTER — Encounter (HOSPITAL_COMMUNITY): Payer: Self-pay | Admitting: Emergency Medicine

## 2017-04-22 ENCOUNTER — Emergency Department (HOSPITAL_COMMUNITY)
Admission: EM | Admit: 2017-04-22 | Discharge: 2017-04-22 | Disposition: A | Discharge status: Home / Self Care | Payer: PPO | Attending: Emergency Medicine | Admitting: Emergency Medicine

## 2017-04-22 DIAGNOSIS — E232 Diabetes insipidus: Secondary | ICD-10-CM | POA: Insufficient documentation

## 2017-04-22 DIAGNOSIS — G8911 Acute pain due to trauma: Secondary | ICD-10-CM | POA: Diagnosis not present

## 2017-04-22 DIAGNOSIS — R4182 Altered mental status, unspecified: Secondary | ICD-10-CM | POA: Diagnosis not present

## 2017-04-22 DIAGNOSIS — S0993XA Unspecified injury of face, initial encounter: Secondary | ICD-10-CM | POA: Diagnosis not present

## 2017-04-22 DIAGNOSIS — Z79899 Other long term (current) drug therapy: Secondary | ICD-10-CM | POA: Diagnosis not present

## 2017-04-22 DIAGNOSIS — E039 Hypothyroidism, unspecified: Secondary | ICD-10-CM | POA: Diagnosis not present

## 2017-04-22 DIAGNOSIS — Z043 Encounter for examination and observation following other accident: Secondary | ICD-10-CM | POA: Diagnosis not present

## 2017-04-22 DIAGNOSIS — G309 Alzheimer's disease, unspecified: Secondary | ICD-10-CM | POA: Insufficient documentation

## 2017-04-22 DIAGNOSIS — W19XXXA Unspecified fall, initial encounter: Secondary | ICD-10-CM

## 2017-04-22 DIAGNOSIS — F0390 Unspecified dementia without behavioral disturbance: Secondary | ICD-10-CM | POA: Diagnosis not present

## 2017-04-22 DIAGNOSIS — S098XXA Other specified injuries of head, initial encounter: Secondary | ICD-10-CM | POA: Diagnosis not present

## 2017-04-22 NOTE — ED Notes (Signed)
Bed: FW26 Expected date:  Expected time:  Means of arrival:  Comments: 83 yo witnessed fall, forehead abrasion

## 2017-04-22 NOTE — ED Provider Notes (Signed)
[facility]-EMER[facility]Y DEPT Provider Note   CSN: [id number] Arrival date & time: 04/22/17  4098     History   Chief Complaint Chief Complaint  Patient presents with  . Fall    Witnessed    HPI Christina Garcia is a 83 y.o. female.  HPI   83 year old female sent for evaluation after a fall. She has a history of advanced dementia and cannot provide much useful history. This was a witnessed fall. She lost her balance of the common area and fell forward onto carpeted surface. Possibly struck her head. No loss of consciousness. No blood thinners.   Past Medical History:  Diagnosis Date  . Alzheimer disease   . Anemia   . DDD (degenerative disc disease), lumbar   . Diabetes insipidus (HCC)   . Dry mouth    husband states she has issue with saliva glands and has to take medicaion fot hsi  . Forgetfulness   . GERD (gastroesophageal reflux disease)   . Hypothyroidism   . IBS (irritable bowel syndrome)   . Insomnia   . Osteoporosis   . Rheumatoid arthritis [facility])     Patient Active Problem List   Diagnosis Date Noted  . Acute hypernatremia 12/29/2015  . Syncope 12/04/2015  . HCAP (healthcare-associated pneumonia) 12/04/2015  . UTI (urinary tract infection) 12/04/2015  . Creatinine elevation   . Hypernatremia 11/20/2015  . AKI (acute kidney injury) (HCC) 11/20/2015  . Diabetes insipidus (HCC) 11/20/2015  . Dementia 11/20/2015  . Hypothyroidism 11/20/2015    Past Surgical History:  Procedure Laterality Date  . ABDOMINAL HYSTERECTOMY    . none      OB History    No data available       Home Medications    Prior to Admission medications   Medication Sig Start Date End Date Taking? Authorizing Provider  cholecalciferol (VITAMIN D) 1000 units tablet Take 1,000 Units by mouth daily.   Yes [provider]  Cranberry 425 MG CAPS Take 1 capsule by mouth 2 (two) times daily.   Yes [provider]  desmopressin (DDAVP) 0.2 MG tablet Take 0.2 mg by mouth daily.     Yes [provider]  levothyroxine (SYNTHROID, LEVOTHROID) 75 MCG tablet Take 75 mcg by mouth daily before breakfast.   Yes [provider]  memantine (NAMENDA) 10 MG tablet Take 1 tablet (10 mg total) by mouth daily. Patient taking differently: Take 10 mg by mouth daily with breakfast.  01/02/16  Yes Leroy Sea, MD  Multiple Vitamins-Minerals (CEROVITE PO) Take 1 tablet by mouth daily with supper.   Yes [provider]  sennosides-docusate sodium (SENOKOT-S) 8.6-50 MG tablet Take 1 tablet by mouth 2 (two) times daily.   Yes [provider]  triamcinolone cream (KENALOG) 0.1 % Apply 1 application topically 2 (two) times daily. x2 weeks 04/08/17  Yes [provider]  docusate sodium (COLACE) 100 MG capsule Take 1 capsule (100 mg total) by mouth 2 (two) times daily as needed for mild constipation. Patient not taking: Reported on 09/09/2016 01/02/16   Leroy Sea, MD  donepezil (ARICEPT) 5 MG tablet Take 1 tablet (5 mg total) by mouth daily before supper. Patient not taking: Reported on 04/22/2017 01/02/16   Leroy Sea, MD    Family History Family History  Problem Relation Age of Onset  . Alzheimer's disease Mother   . Alzheimer's disease Brother     Social History Social History  Substance Use Topics  . Smoking status:  Never Smoker  . Smokeless tobacco: Never Used  . Alcohol use No     Allergies   Keflex [cephalexin] and Fosamax [alendronate sodium]   Review of Systems Review of Systems  All systems reviewed and negative, other than as noted in HPI.   Physical Exam Updated Vital Signs BP 130/65 (BP Location: Right Arm)   Pulse 71   Temp 97.9 F (36.6 C) (Oral)   Resp 15   SpO2 98%   Physical Exam  Constitutional: She appears well-developed and well-nourished. No distress.  HENT:  Head: Normocephalic and atraumatic.  Eyes: Pupils are equal, round, and reactive to light. Conjunctivae are normal. Right eye  exhibits no discharge. Left eye exhibits no discharge.  Neck: Neck supple.  Cardiovascular: Normal rate, regular rhythm and normal heart sounds.  Exam reveals no gallop and no friction rub.   No murmur heard. Pulmonary/Chest: Effort normal and breath sounds normal. No respiratory distress.  Abdominal: Soft. She exhibits no distension. There is no tenderness.  Musculoskeletal: She exhibits no edema or tenderness.  No external signs of trauma. Cannot appreciate an abrasion as noted in triage note. Denies pain and does not seem to react in pain with palpation of her spine. Range of motion large joints palpation extremities does not elicit apparent response either. Abdomen soft, nondistended and nontender.  Neurological: She is alert.  Laying in bed. Awake. No acute distress. Pleasantly demented. Responds to questions, but answers inappropriate/out of context. "I love you." "Thank you son."   Skin: Skin is warm and dry.  Psychiatric: Her behavior is normal.  Nursing note and vitals reviewed.    ED Treatments / Results  Labs (all labs ordered are listed, but only abnormal results are displayed) Labs Reviewed - No data to display  EKG  EKG Interpretation None       Radiology No results found.  Procedures Procedures (including critical care time)  Medications Ordered in ED Medications - No data to display   Initial Impression / Assessment and Plan / ED Course  I have reviewed the triage vital signs and the nursing notes.  Pertinent labs & imaging results that were available during my care of the patient were reviewed by me and considered in my medical decision making (see chart for details).     83 year old female sent for evaluation after witnessed fall. She appears to be at a reported baseline. I cannot appreciate any acute signs of external trauma. She is hemodynamically stable. Afebrile. Very low suspicion for serious traumatic injury. Imaging and other testing was deferred.  She'll be transferred back to her facility.  Final Clinical Impressions(s) / ED Diagnoses   Final diagnoses:  Fall, initial encounter    New Prescriptions New Prescriptions   No medications on file     Raeford Razor, MD 04/22/17 [phone]

## 2017-04-22 NOTE — ED Notes (Signed)
Bed: [facility]B Expected date:  Expected time:  Means of arrival:  Comments: 

## 2017-04-22 NOTE — ED Notes (Signed)
[facility] at bedside for transportation back to facility.

## 2017-04-22 NOTE — ED Triage Notes (Signed)
Pt BIB EMS for WITNESSED fall. Pt was ambulating in the common area and tripped and fell. Small "abrasion" to left forehead, no LOC, no blood thinners. No complaints or grimacing on palpation. Hx of Dementia.

## 2017-04-23 DIAGNOSIS — F5101 Primary insomnia: Secondary | ICD-10-CM | POA: Diagnosis not present

## 2017-04-23 DIAGNOSIS — G301 Alzheimer's disease with late onset: Secondary | ICD-10-CM | POA: Diagnosis not present

## 2017-05-13 DIAGNOSIS — E782 Mixed hyperlipidemia: Secondary | ICD-10-CM | POA: Diagnosis not present

## 2017-05-13 DIAGNOSIS — R600 Localized edema: Secondary | ICD-10-CM | POA: Diagnosis not present

## 2017-05-13 DIAGNOSIS — N390 Urinary tract infection, site not specified: Secondary | ICD-10-CM | POA: Diagnosis not present

## 2017-05-13 DIAGNOSIS — I10 Essential (primary) hypertension: Secondary | ICD-10-CM | POA: Diagnosis not present

## 2017-05-13 DIAGNOSIS — K5900 Constipation, unspecified: Secondary | ICD-10-CM | POA: Diagnosis not present

## 2017-05-13 DIAGNOSIS — N3944 Nocturnal enuresis: Secondary | ICD-10-CM | POA: Diagnosis not present

## 2017-05-13 DIAGNOSIS — R4182 Altered mental status, unspecified: Secondary | ICD-10-CM | POA: Diagnosis not present

## 2017-05-13 DIAGNOSIS — D509 Iron deficiency anemia, unspecified: Secondary | ICD-10-CM | POA: Diagnosis not present

## 2017-05-13 DIAGNOSIS — K210 Gastro-esophageal reflux disease with esophagitis: Secondary | ICD-10-CM | POA: Diagnosis not present

## 2017-05-13 DIAGNOSIS — F5089 Other specified eating disorder: Secondary | ICD-10-CM | POA: Diagnosis not present

## 2017-05-13 DIAGNOSIS — E559 Vitamin D deficiency, unspecified: Secondary | ICD-10-CM | POA: Diagnosis not present

## 2017-05-13 DIAGNOSIS — E039 Hypothyroidism, unspecified: Secondary | ICD-10-CM | POA: Diagnosis not present

## 2017-05-25 DIAGNOSIS — G301 Alzheimer's disease with late onset: Secondary | ICD-10-CM | POA: Diagnosis not present

## 2017-05-25 DIAGNOSIS — K210 Gastro-esophageal reflux disease with esophagitis: Secondary | ICD-10-CM | POA: Diagnosis not present

## 2017-05-25 DIAGNOSIS — E039 Hypothyroidism, unspecified: Secondary | ICD-10-CM | POA: Diagnosis not present

## 2017-06-02 DIAGNOSIS — F5101 Primary insomnia: Secondary | ICD-10-CM | POA: Diagnosis not present

## 2017-06-02 DIAGNOSIS — G301 Alzheimer's disease with late onset: Secondary | ICD-10-CM | POA: Diagnosis not present

## 2017-06-02 DIAGNOSIS — F0280 Dementia in other diseases classified elsewhere without behavioral disturbance: Secondary | ICD-10-CM | POA: Diagnosis not present

## 2017-06-10 DIAGNOSIS — N3944 Nocturnal enuresis: Secondary | ICD-10-CM | POA: Diagnosis not present

## 2017-06-10 DIAGNOSIS — K5900 Constipation, unspecified: Secondary | ICD-10-CM | POA: Diagnosis not present

## 2017-06-10 DIAGNOSIS — R4182 Altered mental status, unspecified: Secondary | ICD-10-CM | POA: Diagnosis not present

## 2017-06-10 DIAGNOSIS — R600 Localized edema: Secondary | ICD-10-CM | POA: Diagnosis not present

## 2017-06-10 DIAGNOSIS — E559 Vitamin D deficiency, unspecified: Secondary | ICD-10-CM | POA: Diagnosis not present

## 2017-06-10 DIAGNOSIS — E039 Hypothyroidism, unspecified: Secondary | ICD-10-CM | POA: Diagnosis not present

## 2017-06-10 DIAGNOSIS — N390 Urinary tract infection, site not specified: Secondary | ICD-10-CM | POA: Diagnosis not present

## 2017-06-10 DIAGNOSIS — E782 Mixed hyperlipidemia: Secondary | ICD-10-CM | POA: Diagnosis not present

## 2017-06-10 DIAGNOSIS — K210 Gastro-esophageal reflux disease with esophagitis: Secondary | ICD-10-CM | POA: Diagnosis not present

## 2017-06-10 DIAGNOSIS — F5089 Other specified eating disorder: Secondary | ICD-10-CM | POA: Diagnosis not present

## 2017-06-10 DIAGNOSIS — I10 Essential (primary) hypertension: Secondary | ICD-10-CM | POA: Diagnosis not present

## 2017-06-10 DIAGNOSIS — D509 Iron deficiency anemia, unspecified: Secondary | ICD-10-CM | POA: Diagnosis not present

## 2017-06-30 DIAGNOSIS — G301 Alzheimer's disease with late onset: Secondary | ICD-10-CM | POA: Diagnosis not present

## 2017-06-30 DIAGNOSIS — F0280 Dementia in other diseases classified elsewhere without behavioral disturbance: Secondary | ICD-10-CM | POA: Diagnosis not present

## 2017-06-30 DIAGNOSIS — F5101 Primary insomnia: Secondary | ICD-10-CM | POA: Diagnosis not present

## 2017-07-08 DIAGNOSIS — R4182 Altered mental status, unspecified: Secondary | ICD-10-CM | POA: Diagnosis not present

## 2017-07-08 DIAGNOSIS — R600 Localized edema: Secondary | ICD-10-CM | POA: Diagnosis not present

## 2017-07-08 DIAGNOSIS — D509 Iron deficiency anemia, unspecified: Secondary | ICD-10-CM | POA: Diagnosis not present

## 2017-07-08 DIAGNOSIS — I10 Essential (primary) hypertension: Secondary | ICD-10-CM | POA: Diagnosis not present

## 2017-07-08 DIAGNOSIS — K5900 Constipation, unspecified: Secondary | ICD-10-CM | POA: Diagnosis not present

## 2017-07-08 DIAGNOSIS — E782 Mixed hyperlipidemia: Secondary | ICD-10-CM | POA: Diagnosis not present

## 2017-07-08 DIAGNOSIS — F5089 Other specified eating disorder: Secondary | ICD-10-CM | POA: Diagnosis not present

## 2017-07-08 DIAGNOSIS — N3944 Nocturnal enuresis: Secondary | ICD-10-CM | POA: Diagnosis not present

## 2017-07-08 DIAGNOSIS — E039 Hypothyroidism, unspecified: Secondary | ICD-10-CM | POA: Diagnosis not present

## 2017-07-08 DIAGNOSIS — E559 Vitamin D deficiency, unspecified: Secondary | ICD-10-CM | POA: Diagnosis not present

## 2017-07-08 DIAGNOSIS — K210 Gastro-esophageal reflux disease with esophagitis: Secondary | ICD-10-CM | POA: Diagnosis not present

## 2017-07-08 DIAGNOSIS — N390 Urinary tract infection, site not specified: Secondary | ICD-10-CM | POA: Diagnosis not present

## 2017-07-16 DIAGNOSIS — B351 Tinea unguium: Secondary | ICD-10-CM | POA: Diagnosis not present

## 2017-07-16 DIAGNOSIS — Q845 Enlarged and hypertrophic nails: Secondary | ICD-10-CM | POA: Diagnosis not present

## 2017-07-16 DIAGNOSIS — L603 Nail dystrophy: Secondary | ICD-10-CM | POA: Diagnosis not present

## 2017-07-16 DIAGNOSIS — I739 Peripheral vascular disease, unspecified: Secondary | ICD-10-CM | POA: Diagnosis not present

## 2017-07-16 DIAGNOSIS — M2010 Hallux valgus (acquired), unspecified foot: Secondary | ICD-10-CM | POA: Diagnosis not present

## 2017-07-16 DIAGNOSIS — L853 Xerosis cutis: Secondary | ICD-10-CM | POA: Diagnosis not present

## 2017-07-16 DIAGNOSIS — R600 Localized edema: Secondary | ICD-10-CM | POA: Diagnosis not present

## 2017-07-23 DIAGNOSIS — N390 Urinary tract infection, site not specified: Secondary | ICD-10-CM | POA: Diagnosis not present

## 2017-07-28 DIAGNOSIS — F5101 Primary insomnia: Secondary | ICD-10-CM | POA: Diagnosis not present

## 2017-07-28 DIAGNOSIS — G301 Alzheimer's disease with late onset: Secondary | ICD-10-CM | POA: Diagnosis not present

## 2017-07-28 DIAGNOSIS — F0280 Dementia in other diseases classified elsewhere without behavioral disturbance: Secondary | ICD-10-CM | POA: Diagnosis not present

## 2017-07-29 DIAGNOSIS — N390 Urinary tract infection, site not specified: Secondary | ICD-10-CM | POA: Diagnosis not present

## 2017-07-29 DIAGNOSIS — R109 Unspecified abdominal pain: Secondary | ICD-10-CM | POA: Diagnosis not present

## 2017-08-05 DIAGNOSIS — K210 Gastro-esophageal reflux disease with esophagitis: Secondary | ICD-10-CM | POA: Diagnosis not present

## 2017-08-05 DIAGNOSIS — R600 Localized edema: Secondary | ICD-10-CM | POA: Diagnosis not present

## 2017-08-05 DIAGNOSIS — E039 Hypothyroidism, unspecified: Secondary | ICD-10-CM | POA: Diagnosis not present

## 2017-08-05 DIAGNOSIS — R4182 Altered mental status, unspecified: Secondary | ICD-10-CM | POA: Diagnosis not present

## 2017-08-05 DIAGNOSIS — D509 Iron deficiency anemia, unspecified: Secondary | ICD-10-CM | POA: Diagnosis not present

## 2017-08-05 DIAGNOSIS — F5089 Other specified eating disorder: Secondary | ICD-10-CM | POA: Diagnosis not present

## 2017-08-05 DIAGNOSIS — E782 Mixed hyperlipidemia: Secondary | ICD-10-CM | POA: Diagnosis not present

## 2017-08-05 DIAGNOSIS — E559 Vitamin D deficiency, unspecified: Secondary | ICD-10-CM | POA: Diagnosis not present

## 2017-08-05 DIAGNOSIS — N390 Urinary tract infection, site not specified: Secondary | ICD-10-CM | POA: Diagnosis not present

## 2017-08-05 DIAGNOSIS — N3944 Nocturnal enuresis: Secondary | ICD-10-CM | POA: Diagnosis not present

## 2017-08-05 DIAGNOSIS — I10 Essential (primary) hypertension: Secondary | ICD-10-CM | POA: Diagnosis not present

## 2017-08-05 DIAGNOSIS — K5900 Constipation, unspecified: Secondary | ICD-10-CM | POA: Diagnosis not present

## 2017-08-10 IMAGING — US US RENAL
1 series · 14 of 25 positions shown · non-contrast
Comparison: None.

CLINICAL DATA: Increasing creatinine

EXAM:
RENAL / URINARY TRACT ULTRASOUND COMPLETE

[Series 1: us renal · 0.20mm/px · 14 of 31 slices shown]
[im 1/31]
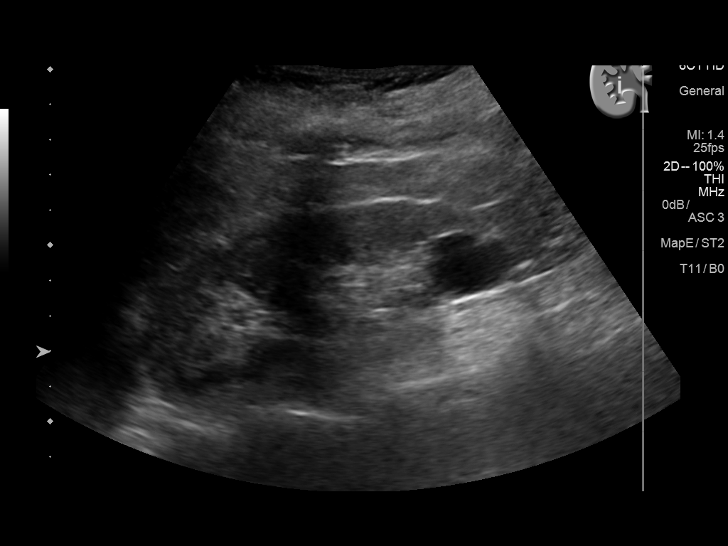
[im 3/31]
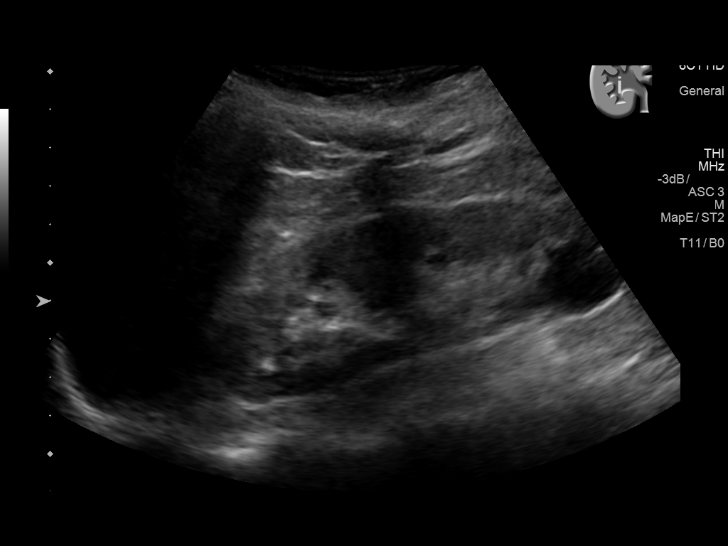
[im 6/31]
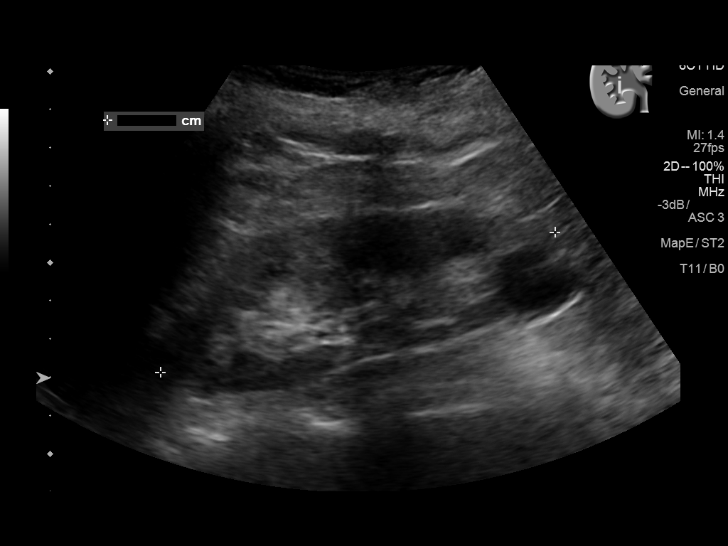
[im 8/31]
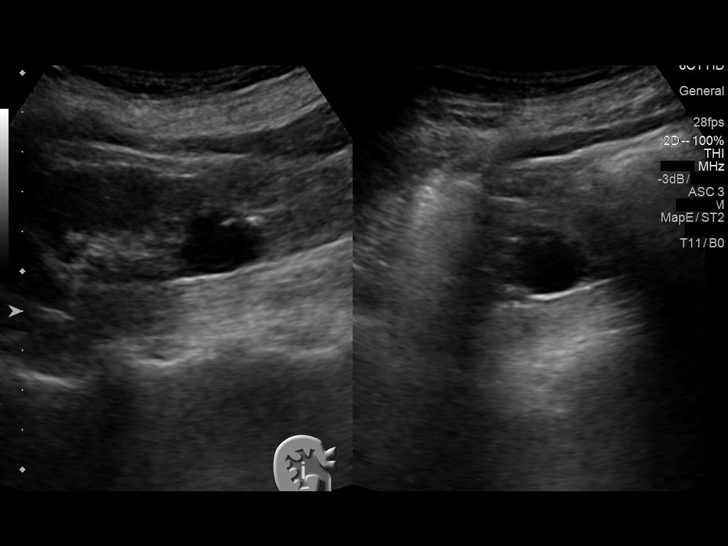
[im 11/31]
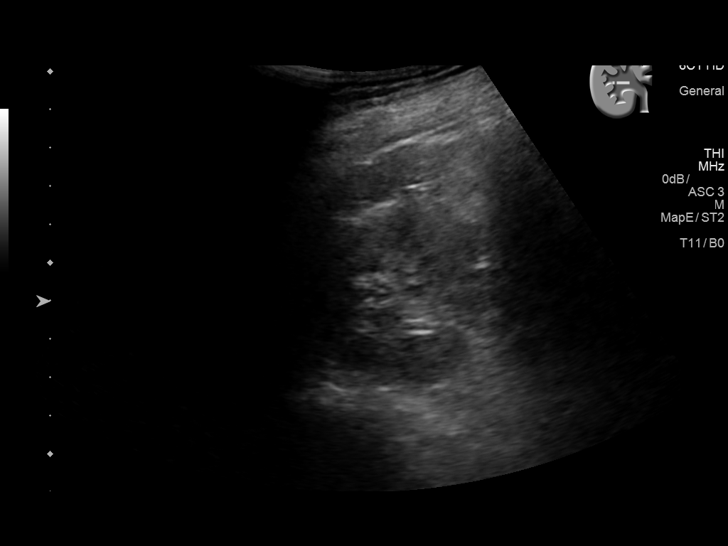
[im 12/31]
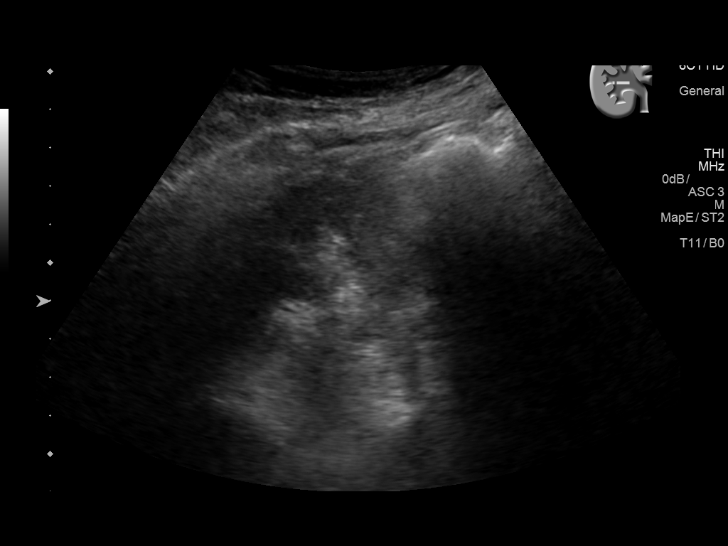
[im 14/31]
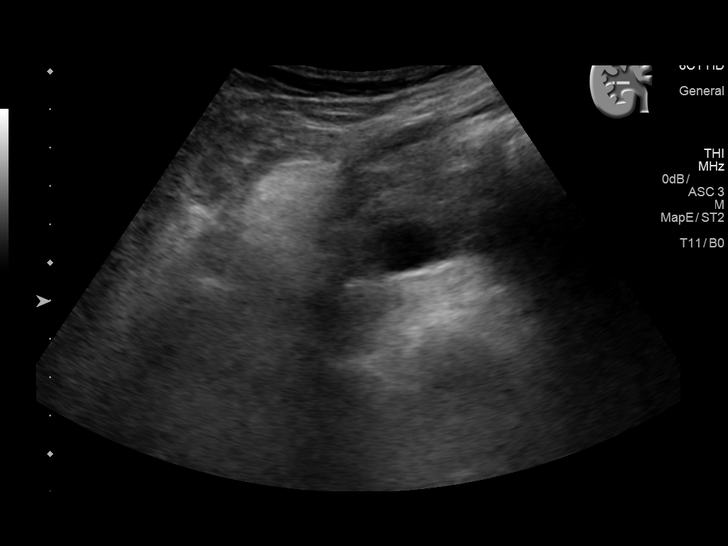
[im 17/31]
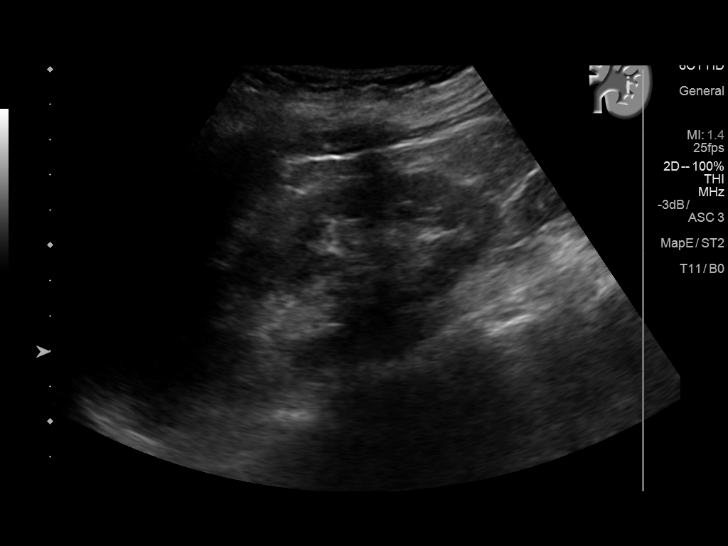
[im 19/31]
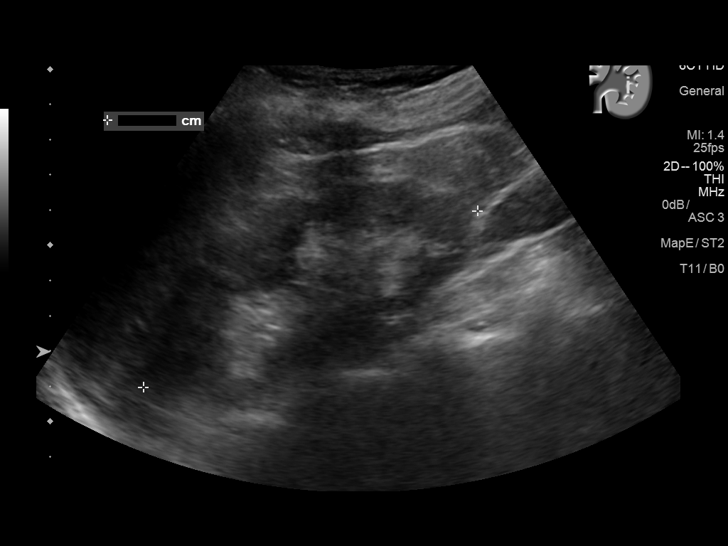
[im 21/31]
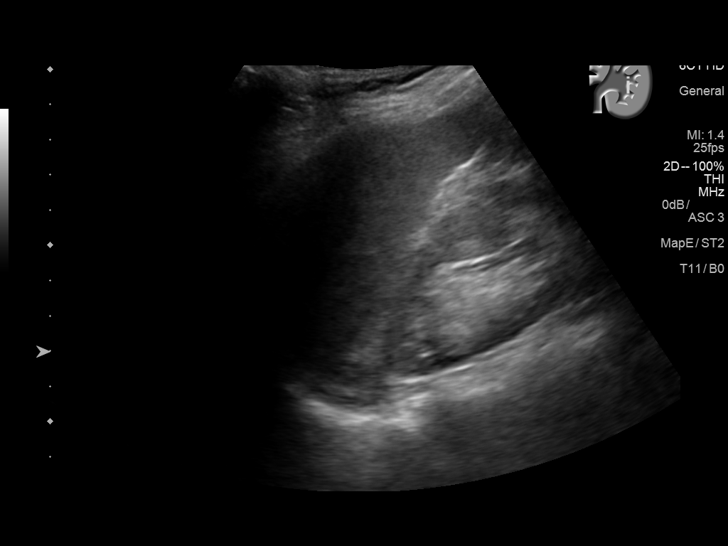
[im 23/31]
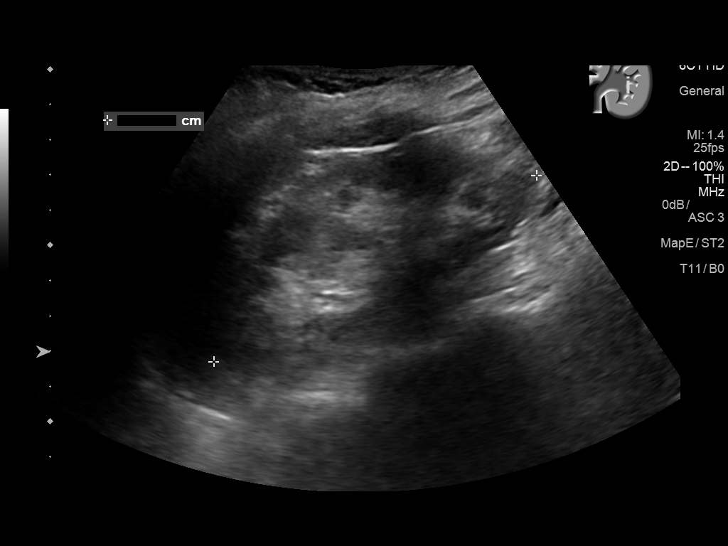
[im 26/31]
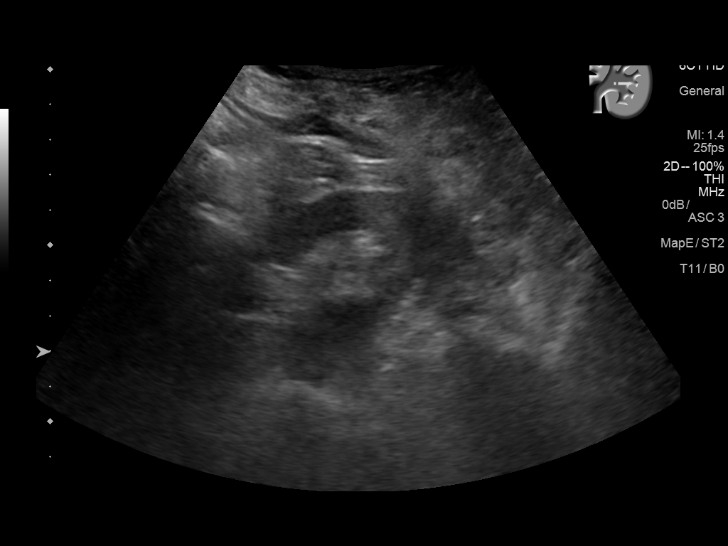
[im 28/31]
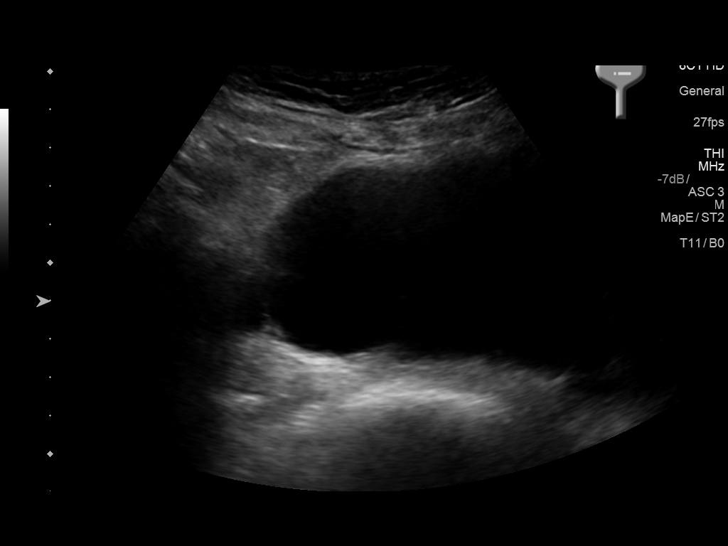
[im 31/31]
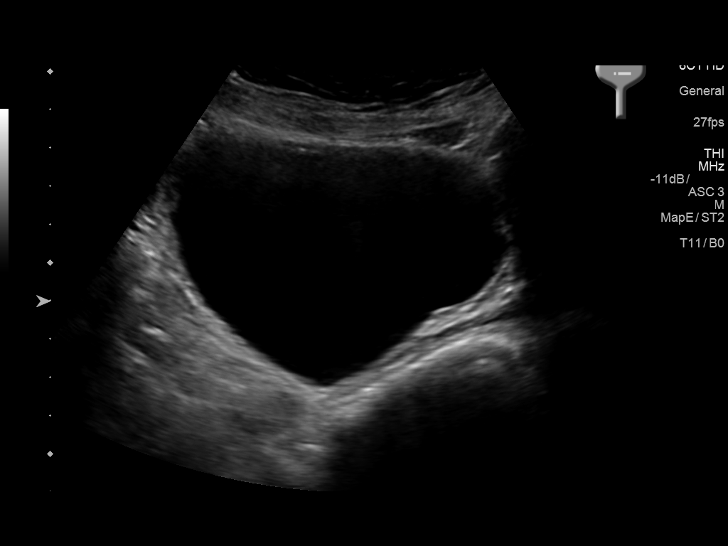

[14 of 25 positions shown; findings below may reference images not displayed]

FINDINGS: Right Kidney:

Length: 11 cm. Echogenicity within normal limits. 2.3 x 1.7 x 2 cm
anechoic right inferior pole renal mass with a thin septation most
consistent with a cyst. No hydronephrosis visualized.

Left Kidney:

Length: 10.6 cm. Echogenicity within normal limits. No mass or
hydronephrosis visualized.

Bladder:

Appears normal for degree of bladder distention.
IMPRESSION: 1. No obstructive uropathy.

## 2017-08-27 DIAGNOSIS — F0280 Dementia in other diseases classified elsewhere without behavioral disturbance: Secondary | ICD-10-CM | POA: Diagnosis not present

## 2017-08-27 DIAGNOSIS — F5101 Primary insomnia: Secondary | ICD-10-CM | POA: Diagnosis not present

## 2017-08-27 DIAGNOSIS — G301 Alzheimer's disease with late onset: Secondary | ICD-10-CM | POA: Diagnosis not present

## 2017-09-01 DIAGNOSIS — N390 Urinary tract infection, site not specified: Secondary | ICD-10-CM | POA: Diagnosis not present

## 2017-09-01 DIAGNOSIS — K210 Gastro-esophageal reflux disease with esophagitis: Secondary | ICD-10-CM | POA: Diagnosis not present

## 2017-09-01 DIAGNOSIS — E782 Mixed hyperlipidemia: Secondary | ICD-10-CM | POA: Diagnosis not present

## 2017-09-01 DIAGNOSIS — R600 Localized edema: Secondary | ICD-10-CM | POA: Diagnosis not present

## 2017-09-01 DIAGNOSIS — E559 Vitamin D deficiency, unspecified: Secondary | ICD-10-CM | POA: Diagnosis not present

## 2017-09-01 DIAGNOSIS — R4182 Altered mental status, unspecified: Secondary | ICD-10-CM | POA: Diagnosis not present

## 2017-09-01 DIAGNOSIS — D509 Iron deficiency anemia, unspecified: Secondary | ICD-10-CM | POA: Diagnosis not present

## 2017-09-01 DIAGNOSIS — F5089 Other specified eating disorder: Secondary | ICD-10-CM | POA: Diagnosis not present

## 2017-09-01 DIAGNOSIS — K5900 Constipation, unspecified: Secondary | ICD-10-CM | POA: Diagnosis not present

## 2017-09-01 DIAGNOSIS — E039 Hypothyroidism, unspecified: Secondary | ICD-10-CM | POA: Diagnosis not present

## 2017-09-01 DIAGNOSIS — N3944 Nocturnal enuresis: Secondary | ICD-10-CM | POA: Diagnosis not present

## 2017-09-01 DIAGNOSIS — I10 Essential (primary) hypertension: Secondary | ICD-10-CM | POA: Diagnosis not present

## 2017-09-02 DIAGNOSIS — K5900 Constipation, unspecified: Secondary | ICD-10-CM | POA: Diagnosis not present

## 2017-09-02 DIAGNOSIS — N390 Urinary tract infection, site not specified: Secondary | ICD-10-CM | POA: Diagnosis not present

## 2017-09-02 DIAGNOSIS — N3944 Nocturnal enuresis: Secondary | ICD-10-CM | POA: Diagnosis not present

## 2017-09-02 DIAGNOSIS — R600 Localized edema: Secondary | ICD-10-CM | POA: Diagnosis not present

## 2017-09-02 DIAGNOSIS — I10 Essential (primary) hypertension: Secondary | ICD-10-CM | POA: Diagnosis not present

## 2017-09-02 DIAGNOSIS — E039 Hypothyroidism, unspecified: Secondary | ICD-10-CM | POA: Diagnosis not present

## 2017-09-02 DIAGNOSIS — R4182 Altered mental status, unspecified: Secondary | ICD-10-CM | POA: Diagnosis not present

## 2017-09-02 DIAGNOSIS — E782 Mixed hyperlipidemia: Secondary | ICD-10-CM | POA: Diagnosis not present

## 2017-09-02 DIAGNOSIS — K210 Gastro-esophageal reflux disease with esophagitis: Secondary | ICD-10-CM | POA: Diagnosis not present

## 2017-09-02 DIAGNOSIS — D509 Iron deficiency anemia, unspecified: Secondary | ICD-10-CM | POA: Diagnosis not present

## 2017-09-02 DIAGNOSIS — F5089 Other specified eating disorder: Secondary | ICD-10-CM | POA: Diagnosis not present

## 2017-09-02 DIAGNOSIS — E559 Vitamin D deficiency, unspecified: Secondary | ICD-10-CM | POA: Diagnosis not present

## 2018-02-10 IMAGING — CT CT HEAD W/O CM
2 series · 15 of 30 positions shown, 17 images · non-contrast
Comparison: None.

CLINICAL DATA: Acute onset of altered mental status. Vomiting.
Initial encounter.

EXAM:
CT HEAD WITHOUT CONTRAST
TECHNIQUE: Contiguous axial images were obtained from the base of the skull
through the vertex without intravenous contrast.

[Series 2: head without · axial · non-contrast · 0.40mm/px · z∈[-114,+1]mm · 7 of 31 slices shown, 9 images]
[im 4/31  brain]
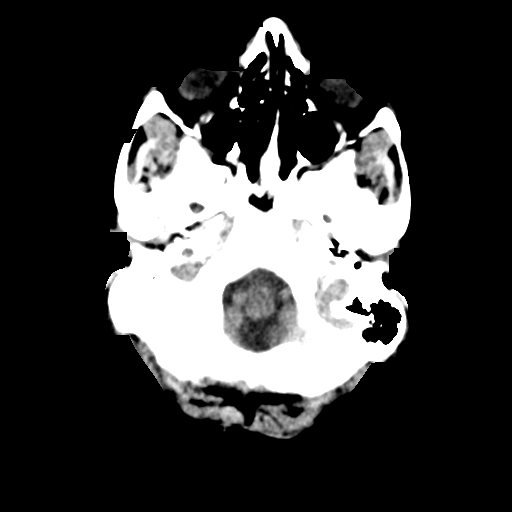
[im 4/31  bone]
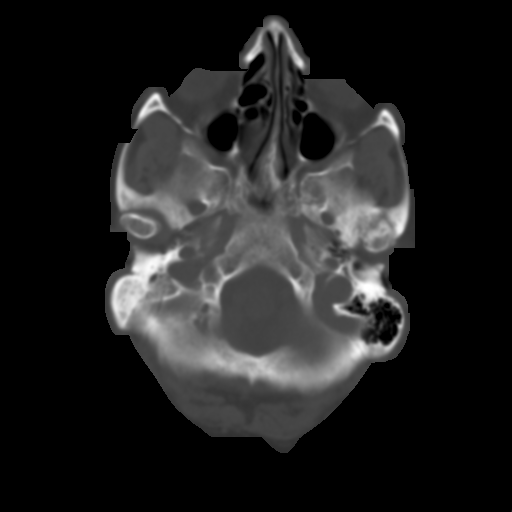
[im 8/31  brain]
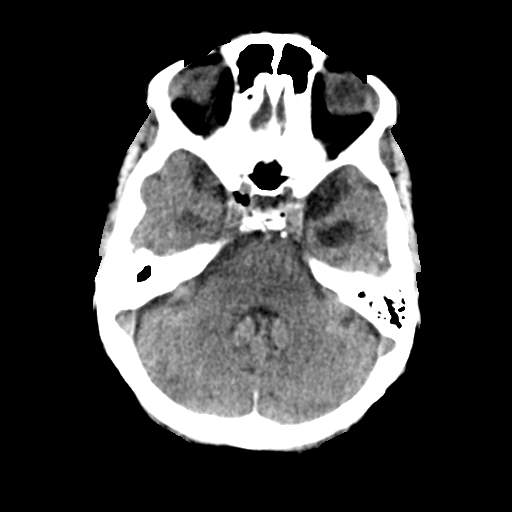
[im 12/31  brain]
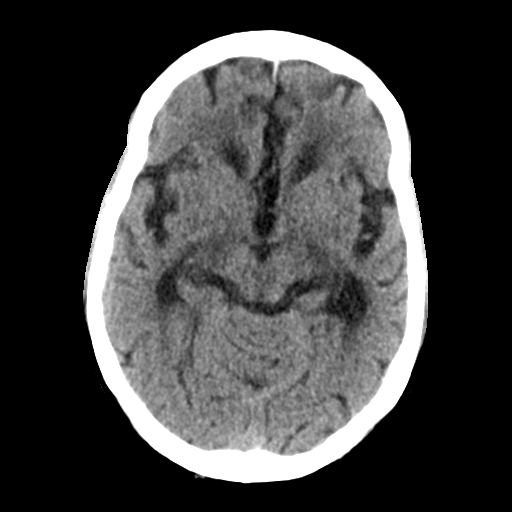
[im 16/31  brain]
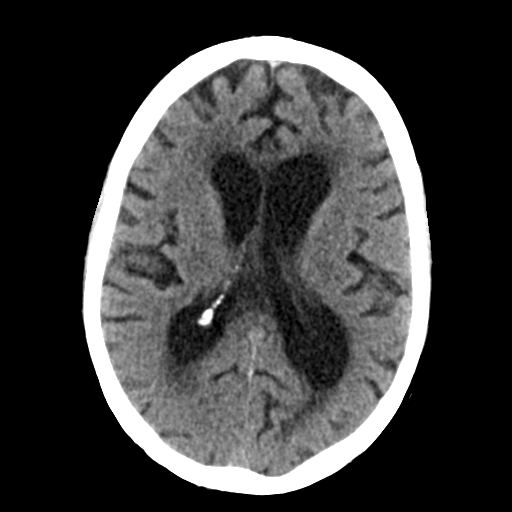
[im 19/31  brain]
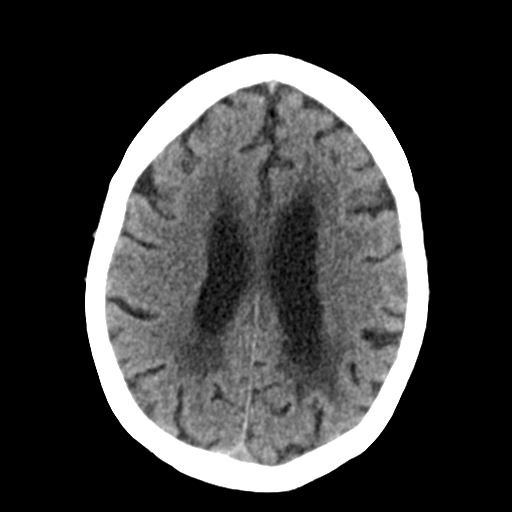
[im 19/31  bone]
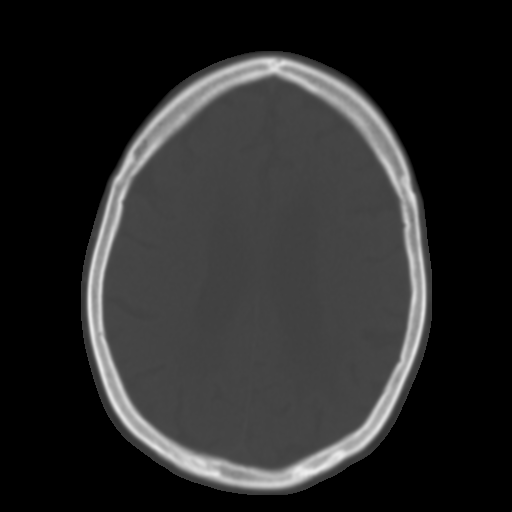
[im 23/31  brain]
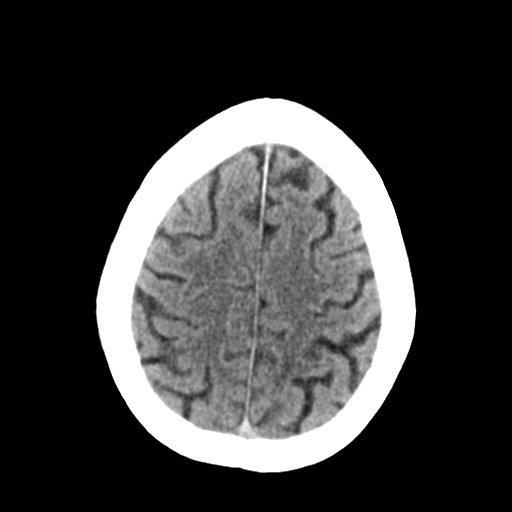
[im 27/31  brain]
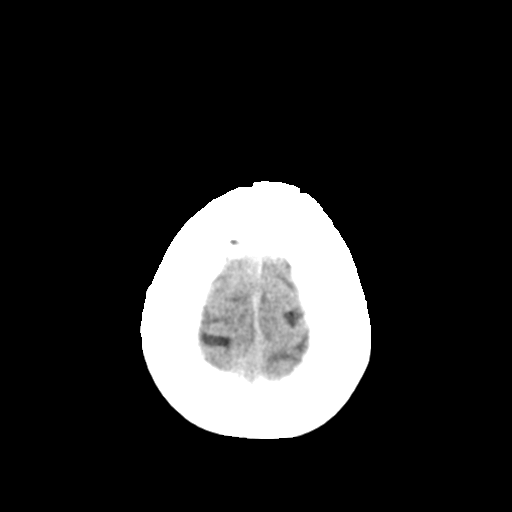

[Series 3: head bone · axial · 0.40mm/px · z∈[-115,+9]mm · 8 of 78 slices shown]
[im 8/78  bone]
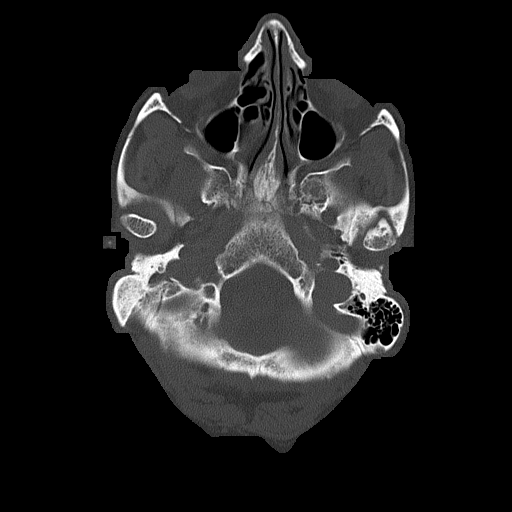
[im 16/78  bone]
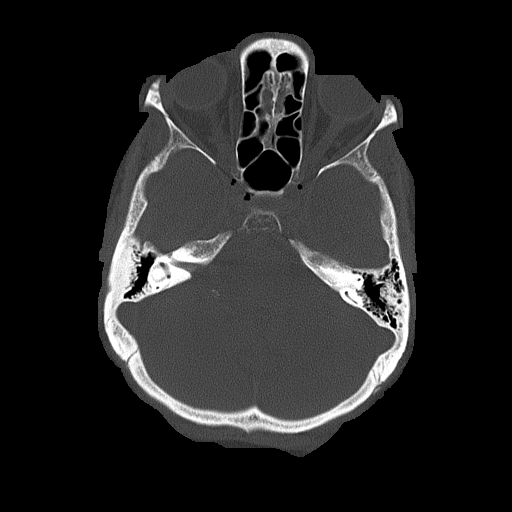
[im 24/78  bone]
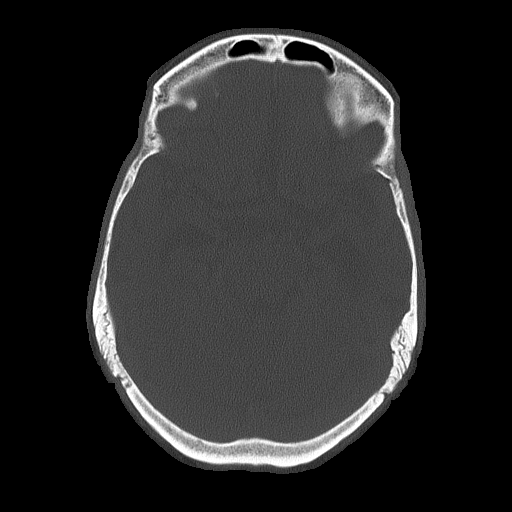
[im 35/78  bone]
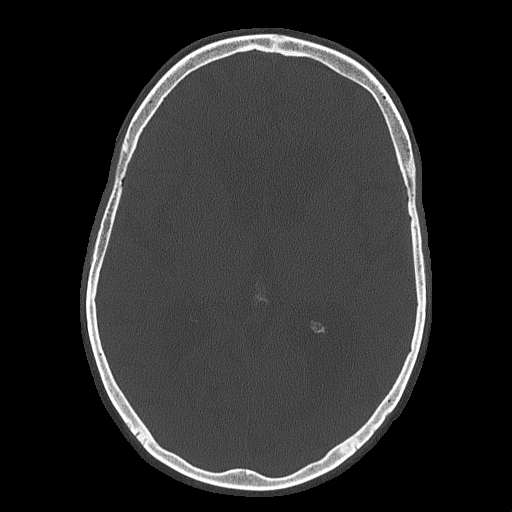
[im 43/78  bone]
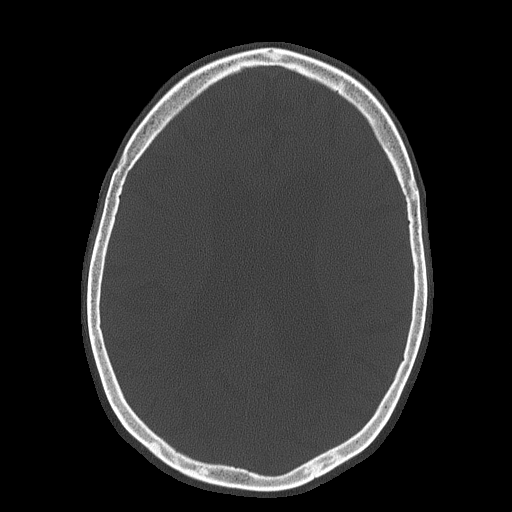
[im 54/78  bone]
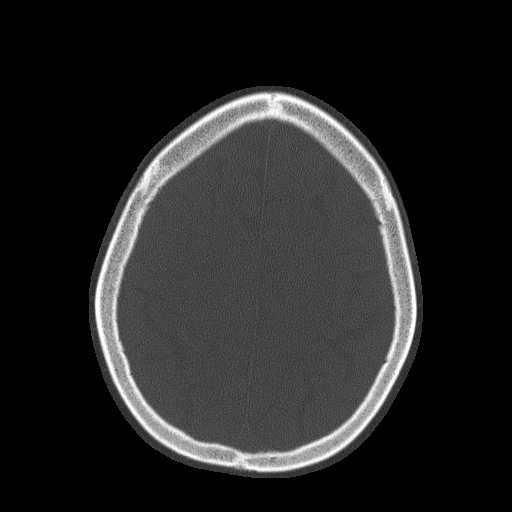
[im 62/78  bone]
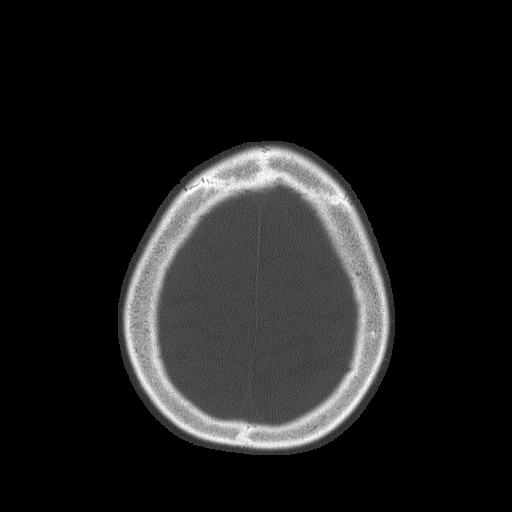
[im 70/78  bone]
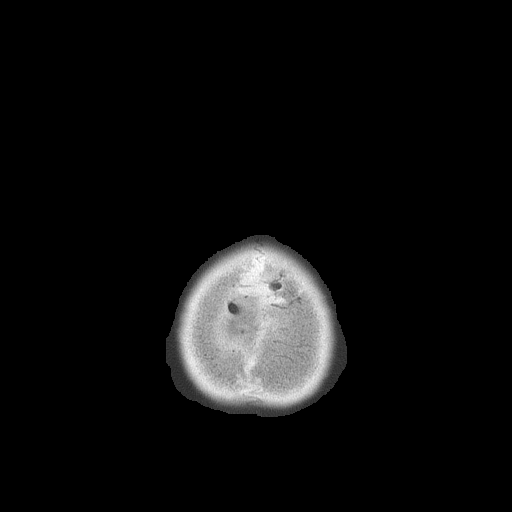

[15 of 30 positions shown; findings below may reference images not displayed]

FINDINGS: There is no evidence of acute infarction, mass lesion, or intra- or
extra-axial hemorrhage on CT.

Prominence of ventricles and sulci reflects mild to moderate
cortical volume loss. Scattered periventricular white matter change
likely reflects small vessel ischemic microangiopathy.

The brainstem and fourth ventricle are within normal limits. The
basal ganglia are unremarkable in appearance. The cerebral
hemispheres demonstrate grossly normal gray-white differentiation.
No mass effect or midline shift is seen.

There is no evidence of fracture; visualized osseous structures are
unremarkable in appearance. The visualized portions of the orbits
are within normal limits. The paranasal sinuses and mastoid air
cells are well-aerated. No significant soft tissue abnormalities are
seen.
IMPRESSION: 1. No acute intracranial pathology seen on CT.
2. Mild to moderate cortical volume loss and scattered small vessel
ischemic microangiopathy.

## 2018-08-26 DEATH — deceased
